# Patient Record
Sex: Male | Born: 1970 | Hispanic: Yes | Marital: Married | Smoking: Former smoker
Health system: Southern US, Community
[De-identification: ages and names within clinical notes are randomized; demographics above are authoritative.]

## PROBLEM LIST (undated history)

## (undated) DIAGNOSIS — K219 Gastro-esophageal reflux disease without esophagitis: Secondary | ICD-10-CM

## (undated) DIAGNOSIS — J45909 Unspecified asthma, uncomplicated: Secondary | ICD-10-CM

## (undated) DIAGNOSIS — N529 Male erectile dysfunction, unspecified: Secondary | ICD-10-CM

## (undated) DIAGNOSIS — I10 Essential (primary) hypertension: Secondary | ICD-10-CM

## (undated) DIAGNOSIS — N400 Enlarged prostate without lower urinary tract symptoms: Secondary | ICD-10-CM

## (undated) HISTORY — DX: Benign prostatic hyperplasia without lower urinary tract symptoms: N40.0

## (undated) HISTORY — PX: HERNIA REPAIR: SHX51

## (undated) HISTORY — DX: Male erectile dysfunction, unspecified: N52.9

## (undated) HISTORY — DX: Gastro-esophageal reflux disease without esophagitis: K21.9

## (undated) HISTORY — PX: PROSTATECTOMY: SHX69

## (undated) HISTORY — DX: Unspecified asthma, uncomplicated: J45.909

## (undated) HISTORY — DX: Essential (primary) hypertension: I10

## (undated) HISTORY — PX: OTHER SURGICAL HISTORY: SHX169

---

## 2013-12-02 DIAGNOSIS — J45909 Unspecified asthma, uncomplicated: Secondary | ICD-10-CM

## 2013-12-02 DIAGNOSIS — J309 Allergic rhinitis, unspecified: Secondary | ICD-10-CM | POA: Insufficient documentation

## 2013-12-02 HISTORY — DX: Unspecified asthma, uncomplicated: J45.909

## 2017-03-06 DIAGNOSIS — N529 Male erectile dysfunction, unspecified: Secondary | ICD-10-CM

## 2017-03-06 DIAGNOSIS — N400 Enlarged prostate without lower urinary tract symptoms: Secondary | ICD-10-CM

## 2017-03-06 HISTORY — DX: Male erectile dysfunction, unspecified: N52.9

## 2017-03-06 HISTORY — DX: Benign prostatic hyperplasia without lower urinary tract symptoms: N40.0

## 2017-05-04 ENCOUNTER — Encounter: Payer: Self-pay | Admitting: Osteopathic Medicine

## 2017-05-04 ENCOUNTER — Ambulatory Visit (INDEPENDENT_AMBULATORY_CARE_PROVIDER_SITE_OTHER): Payer: Managed Care, Other (non HMO) | Admitting: Osteopathic Medicine

## 2017-05-04 VITALS — BP 133/76 | HR 58 | Wt 188.0 lb

## 2017-05-04 DIAGNOSIS — J309 Allergic rhinitis, unspecified: Secondary | ICD-10-CM

## 2017-05-04 DIAGNOSIS — F411 Generalized anxiety disorder: Secondary | ICD-10-CM | POA: Diagnosis not present

## 2017-05-04 DIAGNOSIS — N401 Enlarged prostate with lower urinary tract symptoms: Secondary | ICD-10-CM

## 2017-05-04 DIAGNOSIS — E876 Hypokalemia: Secondary | ICD-10-CM

## 2017-05-04 DIAGNOSIS — I10 Essential (primary) hypertension: Secondary | ICD-10-CM

## 2017-05-04 DIAGNOSIS — R490 Dysphonia: Secondary | ICD-10-CM | POA: Diagnosis not present

## 2017-05-04 DIAGNOSIS — J4540 Moderate persistent asthma, uncomplicated: Secondary | ICD-10-CM

## 2017-05-04 DIAGNOSIS — R3912 Poor urinary stream: Secondary | ICD-10-CM

## 2017-05-04 MED ORDER — BENAZEPRIL HCL 20 MG PO TABS
20.0000 mg | ORAL_TABLET | Freq: Every day | ORAL | 1 refills | Status: DC
Start: 2017-05-04 — End: 2017-06-01

## 2017-05-04 MED ORDER — BECLOMETHASONE DIPROPIONATE 80 MCG/ACT IN AERS: 2.0000 | INHALATION_SPRAY | Freq: Two times a day (BID) | RESPIRATORY_TRACT | 12 refills | Status: DC

## 2017-05-04 MED ORDER — METHYLPREDNISOLONE 4 MG PO TBPK: ORAL_TABLET | ORAL | 0 refills | Status: DC

## 2017-05-04 MED ORDER — ALBUTEROL SULFATE HFA 108 (90 BASE) MCG/ACT IN AERS: 1.0000 | INHALATION_SPRAY | RESPIRATORY_TRACT | 11 refills | Status: DC | PRN

## 2017-05-04 MED ORDER — ESCITALOPRAM OXALATE 5 MG PO TABS: 5.0000 mg | ORAL_TABLET | Freq: Every day | ORAL | 1 refills | Status: DC

## 2017-05-04 MED ORDER — ALPRAZOLAM 0.25 MG PO TABS: 0.2500 mg | ORAL_TABLET | Freq: Two times a day (BID) | ORAL | 1 refills | Status: DC | PRN

## 2017-05-04 NOTE — Patient Instructions (Addendum)
Plan  Blood pressure medicines:  STOP amlodipine START benazepril  Recheck blood pressure in 2 weeks with the nurse, bring your home blood pressure monitor to that visit  Anxiety: START escitalopram / Lexapro daily maintenance and prevention ONLY AS NEEDED alprazolam / Xanax for severe panic symptoms - no more than 2 or 3 times per week, and once the Lexapro gets in your system, hopefully less or not at all  Hoarsenss: START steroid treatment RESTART inhaler - ask your pharmacist if they can recommend a cheaper medicine Will send to specialist if not better   Asthma: RESTART Qvar inhaler daily for maintenance and prevention ONLY AS NEEDED albuterol for shortness of breath   Other: Labs ASAP

## 2017-05-04 NOTE — Progress Notes (Signed)
HPI: Nathan Lang is a 46 y.o. male  who presents to [facility] today, 05/04/17,  for chief complaint of:  Chief Complaint  Patient presents with  . Establish Care  . Cough    Cough and hoarseness - hard to talk, feels like he can't speak very loudly. Treated for pharyngitis/laryngitis in the ER.   Anxiety - Worse with stress, gets easily frustrated/irritable. Previously ER also wrote for alprazolam, looks like this is when he was there for chest pain rule out. Patient states that this was working quite well for him. He was not counseled on potential for dependence with this medicine and was using it pretty much every day until he ran out.  HTN - has been getting refills through the ER. Seeing cardiology previously and recent stress test showed no concerns per patient records are not available at this time, test performed 03/26/17 treadmill stress echo.  He is concerned about blood pressure going up on occasion despite amlodipine treatment. He is measuring blood pressure at home, unverified cuff. Try switching medications. He states he gets occasional headaches when blood pressure is high.   Urinary frequency/BPH: following with urology. Last visit 03/06/2017, records reviewed. Medical management with tamsulosin, oxybutynin. Also sildenafil was prescribed for ED.  Asthma: Has been off of inhalers for some time due to cost. No major shortness of breath but occasional cough.  Hypokalemia: Potassium was 2.8 when measured in ED 02/22/2017, not sure that this was ever addressed. We'll of course repeat today  Patient is accompanied by wife who assists with history-taking. Patient speaks very good [place] but occasionally struggles with certain words. She helps to translate when needed.  Past medical, surgical, social and family history reviewed: Patient Active Problem List   Diagnosis Date Noted  . GERD (gastroesophageal reflux disease) 05/05/2017  .  Hypertension 05/05/2017  . BPH (benign prostatic hyperplasia) 03/06/2017  . ED (erectile dysfunction) 03/06/2017  . Allergic rhinitis 12/02/2013  . Asthma 12/02/2013   Past Surgical History:  Procedure Laterality Date  . BREAST MASS EXCISION    . HERNIA REPAIR     Social History  Substance Use Topics  . Smoking status: Current Some Day Smoker    Types: Cigarettes  . Smokeless tobacco: Never Used  . Alcohol use Yes   Family History  Problem Relation Age of Onset  . Hypertension Mother   . Hypertension Brother      Current medication list and allergy/intolerance information reviewed:   Current Outpatient Prescriptions  Medication Sig Dispense Refill  . amLODipine (NORVASC) 5 MG tablet TAKE 1 TABLET BY MOUTH EVERY DAY    . beclomethasone (QVAR) 80 MCG/ACT inhaler Inhale into the lungs 2 (two) times daily.    Marland Kitchen oxybutynin (DITROPAN) 5 MG tablet Take by mouth.    . tamsulosin (FLOMAX) 0.4 MG CAPS capsule Take by mouth.     No current facility-administered medications for this visit.    No Known Allergies    Review of Systems:  Constitutional:  No  fever, no chills, No recent illness, No unintentional weight changes. No significant fatigue.   HEENT: No  headache, no vision change, no hearing change  Cardiac: No  chest pain, No  pressure, No palpitations, No  Orthopnea  Respiratory:  No  shortness of breath. No  Cough  Gastrointestinal: No  abdominal pain, No  nausea  Musculoskeletal: No new myalgia/arthralgia  Skin: No  Rash  Hem/Onc: No  easy bruising/bleeding,   Neurologic: No  weakness, No  dizziness  Psychiatric: No  concerns with depression, +concerns with anxiety, No sleep problems, No mood problems  Exam:  BP 133/76   Pulse (!) 58   Wt 188 lb (85.3 kg)   Constitutional: VS see above. General Appearance: alert, well-developed, well-nourished, NAD  Eyes: Normal lids and conjunctive, non-icteric sclera  Ears, Nose, Mouth, Throat: MMM, Normal external  inspection ears/nares/mouth/lips/gums.   Neck: No masses, trachea midline. No thyroid enlargement.   Respiratory: Normal respiratory effort. no wheeze, no rhonchi, no rales  Cardiovascular: S1/S2 normal, no murmur, no rub/gallop auscultated. RRR. No lower extremity edema.   Musculoskeletal: Gait normal.   Neurological: Normal balance/coordination. No tremor.   Skin: warm, dry, intact. No rash/ulcer.   Psychiatric: Normal judgment/insight. Normal mood and affect. Oriented x3.    ASSESSMENT/PLAN:   Essential hypertension - Plan: COMPLETE METABOLIC PANEL WITH GFR, CBC, Magnesium, Lipid panel  Anxiety - Plan: escitalopram (LEXAPRO) 5 MG tablet  Moderate persistent asthma without complication  Hoarseness of voice  Hypokalemia - Plan: COMPLETE METABOLIC PANEL WITH GFR  Allergic rhinitis, unspecified seasonality, unspecified trigger    Patient Instructions  Plan  Blood pressure medicines:  STOP amlodipine START benazepril  Recheck blood pressure in 2 weeks with the nurse, bring your home blood pressure monitor to that visit  Anxiety: START escitalopram / Lexapro daily maintenance and prevention ONLY AS NEEDED alprazolam / Xanax for severe panic symptoms - no more than 2 or 3 times per week, and once the Lexapro gets in your system, hopefully less or not at all  Hoarsenss: START steroid treatment RESTART inhaler - ask your pharmacist if they can recommend a cheaper medicine Will send to specialist if not better   Asthma: RESTART Qvar inhaler daily for maintenance and prevention ONLY AS NEEDED albuterol for shortness of breath   Other: Labs ASAP       Visit summary with medication list and pertinent instructions was printed for patient to review. All questions at time of visit were answered - patient instructed to contact office with any additional concerns. ER/RTC precautions were reviewed with the patient. Follow-up plan: Return in about 4 weeks (around  06/01/2017) for see how you're doing on new medicines . Encouraged to return in 2 weeks for nurse visit blood pressure check and to verify home monitor, four-week follow-up with Dr. Sheppard Coil for other chronic issues/medication changes  Note: Total time spent 45 minutes, greater than 50% of the visit was spent face-to-face counseling and coordinating care for the following: The primary encounter diagnosis was Essential hypertension. Diagnoses of Generalized anxiety disorder, Moderate persistent asthma without complication, Hoarseness of voice, Hypokalemia, Allergic rhinitis, unspecified seasonality, unspecified trigger, and Benign prostatic hyperplasia with weak urinary stream were also pertinent to this visit.Marland Kitchen

## 2017-05-05 ENCOUNTER — Ambulatory Visit: Payer: Managed Care, Other (non HMO) | Admitting: Osteopathic Medicine

## 2017-05-05 ENCOUNTER — Encounter: Payer: Self-pay | Admitting: Osteopathic Medicine

## 2017-05-05 DIAGNOSIS — I10 Essential (primary) hypertension: Secondary | ICD-10-CM

## 2017-05-05 DIAGNOSIS — K219 Gastro-esophageal reflux disease without esophagitis: Secondary | ICD-10-CM

## 2017-05-05 DIAGNOSIS — R490 Dysphonia: Secondary | ICD-10-CM | POA: Insufficient documentation

## 2017-05-05 DIAGNOSIS — E876 Hypokalemia: Secondary | ICD-10-CM | POA: Insufficient documentation

## 2017-05-05 DIAGNOSIS — F411 Generalized anxiety disorder: Secondary | ICD-10-CM | POA: Insufficient documentation

## 2017-05-05 HISTORY — DX: Gastro-esophageal reflux disease without esophagitis: K21.9

## 2017-05-05 HISTORY — DX: Essential (primary) hypertension: I10

## 2017-05-06 LAB — COMPLETE METABOLIC PANEL WITH GFR
AG RATIO: 2 (calc) (ref 1.0–2.5)
ALKALINE PHOSPHATASE (APISO): 72 U/L (ref 40–115)
ALT: 16 U/L (ref 9–46)
AST: 14 U/L (ref 10–40)
Albumin: 4.6 g/dL (ref 3.6–5.1)
BILIRUBIN TOTAL: 0.5 mg/dL (ref 0.2–1.2)
BUN: 17 mg/dL (ref 7–25)
CHLORIDE: 105 mmol/L (ref 98–110)
CO2: 24 mmol/L (ref 20–32)
Calcium: 9.3 mg/dL (ref 8.6–10.3)
Creat: 0.98 mg/dL (ref 0.60–1.35)
GFR, Est African American: 106 mL/min/{1.73_m2} (ref >=60)
GFR, Est Non African American: 91 mL/min/{1.73_m2} (ref >=60)
GLUCOSE: 105 mg/dL (ref 65–139)
Globulin: 2.3 g/dL (calc) (ref 1.9–3.7)
POTASSIUM: 3.5 mmol/L (ref 3.5–5.3)
Sodium: 138 mmol/L (ref 135–146)
Total Protein: 6.9 g/dL (ref 6.1–8.1)

## 2017-05-06 LAB — CBC
HCT: 42.9 % (ref 38.5–50.0)
Hemoglobin: 14.8 g/dL (ref 13.2–17.1)
MCH: 29.5 pg (ref 27.0–33.0)
MCHC: 34.5 g/dL (ref 32.0–36.0)
MCV: 85.5 fL (ref 80.0–100.0)
MPV: 10.8 fL (ref 7.5–12.5)
Platelets: 209 Thousand/uL (ref 140–400)
RBC: 5.02 Million/uL (ref 4.20–5.80)
RDW: 12.9 % (ref 11.0–15.0)
WBC: 4.7 Thousand/uL (ref 3.8–10.8)

## 2017-05-06 LAB — LIPID PANEL
CHOLESTEROL: 175 mg/dL (ref <200)
HDL: 41 mg/dL (ref >40)
LDL CHOLESTEROL (CALC): 108 mg/dL — AB
Non-HDL Cholesterol (Calc): 134 mg/dL (calc) — ABNORMAL HIGH (ref <130)
TRIGLYCERIDES: 150 mg/dL — AB (ref <150)
Total CHOL/HDL Ratio: 4.3 (calc) (ref <5.0)

## 2017-05-06 LAB — MAGNESIUM: MAGNESIUM: 2 mg/dL (ref 1.5–2.5)

## 2017-05-08 ENCOUNTER — Ambulatory Visit: Payer: Managed Care, Other (non HMO)

## 2017-05-22 ENCOUNTER — Ambulatory Visit: Payer: Managed Care, Other (non HMO)

## 2017-06-01 ENCOUNTER — Encounter: Payer: Self-pay | Admitting: Osteopathic Medicine

## 2017-06-01 ENCOUNTER — Ambulatory Visit (INDEPENDENT_AMBULATORY_CARE_PROVIDER_SITE_OTHER): Payer: Managed Care, Other (non HMO) | Admitting: Osteopathic Medicine

## 2017-06-01 VITALS — BP 154/87 | HR 63 | Temp 98.0°F | Resp 18 | Wt 188.4 lb

## 2017-06-01 DIAGNOSIS — E876 Hypokalemia: Secondary | ICD-10-CM

## 2017-06-01 DIAGNOSIS — Z23 Encounter for immunization: Secondary | ICD-10-CM | POA: Diagnosis not present

## 2017-06-01 DIAGNOSIS — I10 Essential (primary) hypertension: Secondary | ICD-10-CM | POA: Diagnosis not present

## 2017-06-01 DIAGNOSIS — F411 Generalized anxiety disorder: Secondary | ICD-10-CM | POA: Diagnosis not present

## 2017-06-01 LAB — BASIC METABOLIC PANEL WITH GFR
BUN/Creatinine Ratio: 15 (calc) (ref 6–22)
BUN: 21 mg/dL (ref 7–25)
CALCIUM: 9.2 mg/dL (ref 8.6–10.3)
CHLORIDE: 105 mmol/L (ref 98–110)
CO2: 25 mmol/L (ref 20–32)
Creat: 1.4 mg/dL — ABNORMAL HIGH (ref 0.60–1.35)
GFR, EST AFRICAN AMERICAN: 69 mL/min/{1.73_m2} (ref >=60)
GFR, EST NON AFRICAN AMERICAN: 59 mL/min/{1.73_m2} — AB (ref >=60)
Glucose, Bld: 96 mg/dL (ref 65–99)
Potassium: 3.8 mmol/L (ref 3.5–5.3)
Sodium: 139 mmol/L (ref 135–146)

## 2017-06-01 MED ORDER — BENAZEPRIL HCL 40 MG PO TABS: 40.0000 mg | ORAL_TABLET | Freq: Every day | ORAL | 1 refills | Status: DC

## 2017-06-01 NOTE — Progress Notes (Signed)
HPI: Nathan Lang is a 46 y.o. male who  has a past medical history of Asthma (12/02/2013), BPH (benign prostatic hyperplasia) (03/06/2017), ED (erectile dysfunction) (03/06/2017), GERD (gastroesophageal reflux disease) (05/05/2017), Hypertension, and Hypertension (05/05/2017).  he presents to [facility] today, 06/01/17,  for chief complaint of:  Chief Complaint  Patient presents with  . Follow-up    Hypertension  . Medication Management    Anxiety/Panic Attacks    HTN: Blood pressure still a bit on the high side today, he is worried about anxiety issues today. He has been measuring his blood pressure at home and a says it seems to have been a bit high, cannot really give me numbers. He does not have home blood pressure cuff with him today. Her, shortness of breath.  Anxiety: Last visit 05/04/17 "Worse with stress, gets easily frustrated/irritable. Previously ER also wrote for alprazolam, looks like this is when he was there for chest pain rule out. Patient states that this was working quite well for him. He was not counseled on potential for dependence with this medicine and was using it pretty much every day until he ran out." Plan at that point: "START escitalopram / Lexapro daily maintenance and prevention ONLY AS NEEDED alprazolam / Xanax for severe panic symptoms - no more than 2 or 3 times per week, and once the Lexapro gets in your system, hopefully less or not at all."  Today he reports anxiety is not much better. He went back to the ER 05/25/17 for chest pain - ACS ruled out. Recent stress test normal. D/C on Ativan 0.5 mg bid prn anxiety. He reports that he has also only been taking the Lexapro as needed and it has not been very helpful...    Past medical, surgical, social and family history reviewed:  Patient Active Problem List   Diagnosis Date Noted  . GERD (gastroesophageal reflux disease) 05/05/2017  . Hypertension 05/05/2017  . Hoarseness  of voice 05/05/2017  . Hypokalemia 05/05/2017  . Generalized anxiety disorder 05/05/2017  . BPH (benign prostatic hyperplasia) 03/06/2017  . ED (erectile dysfunction) 03/06/2017  . Allergic rhinitis 12/02/2013  . Asthma 12/02/2013    Past Surgical History:  Procedure Laterality Date  . BREAST MASS EXCISION    . HERNIA REPAIR      Social History   Tobacco Use  . Smoking status: Current Some Day Smoker    Types: Cigarettes  . Smokeless tobacco: Never Used  Substance Use Topics  . Alcohol use: Yes    Family History  Problem Relation Age of Onset  . Hypertension Mother   . Hypertension Brother      Current medication list and allergy/intolerance information reviewed:    Current Outpatient Medications  Medication Sig Dispense Refill  . albuterol (PROVENTIL HFA;VENTOLIN HFA) 108 (90 Base) MCG/ACT inhaler Inhale 1-2 puffs into the lungs every 4 (four) hours as needed for wheezing (FOR EMERGENCY USE). 2 Inhaler 11  . beclomethasone (QVAR) 80 MCG/ACT inhaler Inhale 2 puffs into the lungs 2 (two) times daily. EVERY DAY FOR MAINTENANCE 1 Inhaler 12  . benazepril (LOTENSIN) 20 MG tablet Take 1 tablet (20 mg total) by mouth daily. 30 tablet 1  . dexlansoprazole (DEXILANT) 60 MG capsule Take 60 mg daily by mouth.    . escitalopram (LEXAPRO) 5 MG tablet Take 1 tablet (5 mg total) by mouth daily. 90 tablet 1  . LORazepam (ATIVAN) 0.5 MG tablet Take 0.5 mg as directed by mouth.    Marland Kitchen  oxybutynin (DITROPAN) 5 MG tablet Take by mouth.    . tamsulosin (FLOMAX) 0.4 MG CAPS capsule Take 0.4 mg daily by mouth.     No current facility-administered medications for this visit.     No Known Allergies    Review of Systems:  Constitutional:  No  fever, no chills, No recent illness.   HEENT: No  headache, no vision change  Cardiac: No  chest pain, No  pressure, No palpitations, No  Orthopnea  Respiratory:  No  shortness of breath. No  Cough  Gastrointestinal: No  abdominal pain, No   nausea,  Musculoskeletal: No new myalgia/arthralgia  Skin: No  Rash,  Psychiatric: No  concerns with depression, +concerns with anxiety, No sleep problems, No mood problems  Exam:  BP (!) 154/87 (BP Location: Right Arm, Patient Position: Sitting, Cuff Size: Large)   Pulse 63   Temp 98 F (36.7 C) (Oral)   Resp 18   Wt 188 lb 6.4 oz (85.5 kg)   Constitutional: VS see above. General Appearance: alert, well-developed, well-nourished, NAD  Eyes: Normal lids and conjunctive, non-icteric sclera  Ears, Nose, Mouth, Throat: MMM, Normal external inspection ears/nares/mouth/lips/gums.  Neck: No masses, trachea midline. No thyroid enlargement. No tenderness/mass appreciated. No lymphadenopathy  Respiratory: Normal respiratory effort. no wheeze, no rhonchi, no rales  Cardiovascular: S1/S2 normal, no murmur, no rub/gallop auscultated. RRR. No lower extremity edema.    Musculoskeletal: Gait normal.   Neurological: Normal balance/coordination. No tremor.   Psychiatric: Normal judgment/insight. Normal mood and affect. Oriented x3.    ER records reviewed, no major concerns, CMP demonstrated hypokalemia, chest x-ray and EKG not concerning    ASSESSMENT/PLAN:   Generalized anxiety disorder - Advised on daily/maintenance of Lexapro, when necessary only use of benzodiazepines and advised that these are habit forming medications  Hypokalemia - Plan: BASIC METABOLIC PANEL WITH GFR  Need for immunization against influenza - Plan: Flu Vaccine QUAD 36+ mos IM  Essential hypertension - Advised to bring home blood pressure cuff to next visit    Patient Instructions  Plan:  CONTINUE Escitalopram (Lexapro) daily, every day EMERGENCY USE ONLY Lorazepam (Ativan) for severe anxiety  INCREASE Benazepril from 20 mg to 40 mg for blood pressure   Bring your home blood pressure cuff to the office at your next visit so we can make sure it is accurate     Visit summary with medication list and  pertinent instructions was printed for patient to review. All questions at time of visit were answered - patient instructed to contact office with any additional concerns. ER/RTC precautions were reviewed with the patient. Follow-up plan: No Follow-up on file.  Note: Total time spent 25 minutes, greater than 50% of the visit was spent face-to-face counseling and coordinating care for the following: The primary encounter diagnosis was Generalized anxiety disorder. Diagnoses of Hypokalemia, Need for immunization against influenza, and Essential hypertension were also pertinent to this visit.Marland Kitchen  Please note: voice recognition software was used to produce this document, and typos may escape review. Please contact Dr. Sheppard Coil for any needed clarifications.

## 2017-06-01 NOTE — Patient Instructions (Signed)
Plan:  CONTINUE Escitalopram (Lexapro) daily, every day EMERGENCY USE ONLY Lorazepam (Ativan) for severe anxiety  INCREASE Benazepril from 20 mg to 40 mg for blood pressure   Bring your home blood pressure cuff to the office at your next visit so we can make sure it is accurate

## 2017-06-17 ENCOUNTER — Encounter: Payer: Self-pay | Admitting: Osteopathic Medicine

## 2017-06-17 ENCOUNTER — Ambulatory Visit: Payer: Managed Care, Other (non HMO) | Admitting: Osteopathic Medicine

## 2017-06-17 VITALS — BP 147/88 | HR 72 | Temp 97.6°F | Resp 16 | Ht 67.0 in | Wt 184.0 lb

## 2017-06-17 DIAGNOSIS — I10 Essential (primary) hypertension: Secondary | ICD-10-CM | POA: Diagnosis not present

## 2017-06-17 DIAGNOSIS — F411 Generalized anxiety disorder: Secondary | ICD-10-CM

## 2017-06-17 MED ORDER — BENAZEPRIL HCL 40 MG PO TABS: 40.0000 mg | ORAL_TABLET | Freq: Every day | ORAL | 1 refills | Status: DC

## 2017-06-17 MED ORDER — ESCITALOPRAM OXALATE 10 MG PO TABS: 10.0000 mg | ORAL_TABLET | Freq: Every day | ORAL | 1 refills | Status: DC

## 2017-06-17 NOTE — Progress Notes (Signed)
HPI: Nathan Lang is a 46 y.o. male who  has a past medical history of Asthma (12/02/2013), BPH (benign prostatic hyperplasia) (03/06/2017), ED (erectile dysfunction) (03/06/2017), GERD (gastroesophageal reflux disease) (05/05/2017), Hypertension, and Hypertension (05/05/2017).  he presents to [facility] today, 06/17/17,  for chief complaint of:  Chief Complaint  Patient presents with  . Follow-up    anxiety    Anxiety - Worse with stress, gets easily frustrated/irritable. Previously ER also wrote for alprazolam. He was not counseled on potential for dependence with this medicine and was using it pretty much every day until he ran out. Last visit a little over a month ago 05/04/2017, we initiated low dose Lexapro for maintenance and as needed alprazolam for panic symptoms, instructed use only 2-3 times per week. He reports some improvement today on the citalopram and he has the benzodiazepine leftover  HTN - previously felt like blood pressure was fluctuating on amlodipine alone. We tried benazepril at previous visit. He states he gets occasional headaches when blood pressure is high.  Recent evaluation/treatment in ER for chest pain, 05/24/2017, blood pressure at that time was 180s /110s. THen cam down to 158/83. Has not been taking the benazepril at full dose, has been taking 20 mg.  No chest pain, pressure, shortness of breath or headache today. 162/101 on home cuff 147/88 immediately after on automatic cuff.    Past medical, surgical, social and family history reviewed:  Patient Active Problem List   Diagnosis Date Noted  . GERD (gastroesophageal reflux disease) 05/05/2017  . Hypertension 05/05/2017  . Hoarseness of voice 05/05/2017  . Hypokalemia 05/05/2017  . Generalized anxiety disorder 05/05/2017  . BPH (benign prostatic hyperplasia) 03/06/2017  . ED (erectile dysfunction) 03/06/2017  . Allergic rhinitis 12/02/2013  . Asthma 12/02/2013     Past Surgical History:  Procedure Laterality Date  . BREAST MASS EXCISION    . HERNIA REPAIR      Social History   Tobacco Use  . Smoking status: Current Some Day Smoker    Types: Cigarettes  . Smokeless tobacco: Never Used  Substance Use Topics  . Alcohol use: Yes    Family History  Problem Relation Age of Onset  . Hypertension Mother   . Hypertension Brother      Current medication list and allergy/intolerance information reviewed:    Current Outpatient Medications  Medication Sig Dispense Refill  . albuterol (PROVENTIL HFA;VENTOLIN HFA) 108 (90 Base) MCG/ACT inhaler Inhale 1-2 puffs into the lungs every 4 (four) hours as needed for wheezing (FOR EMERGENCY USE). 2 Inhaler 11  . beclomethasone (QVAR) 80 MCG/ACT inhaler Inhale 2 puffs into the lungs 2 (two) times daily. EVERY DAY FOR MAINTENANCE 1 Inhaler 12  . benazepril (LOTENSIN) 40 MG tablet Take 1 tablet (40 mg total) daily by mouth. (Patient taking differently: Take 40 mg by mouth daily. Patient takes 20 mg) 30 tablet 1  . dexlansoprazole (DEXILANT) 60 MG capsule Take 60 mg daily by mouth.    . escitalopram (LEXAPRO) 5 MG tablet Take 1 tablet (5 mg total) by mouth daily. 90 tablet 1  . LORazepam (ATIVAN) 0.5 MG tablet Take 0.5 mg as directed by mouth.    . oxybutynin (DITROPAN) 5 MG tablet Take by mouth.    . tamsulosin (FLOMAX) 0.4 MG CAPS capsule Take 0.4 mg daily by mouth.     No current facility-administered medications for this visit.     No Known Allergies    Review of Systems:  Constitutional:  No  fever, no chills,  HEENT: No  headache,  Cardiac: No  chest pain, No  pressure, No palpitations, No  Orthopnea  Respiratory:  No  shortness of breath. No  Cough  Neurologic: No  weakness, No  dizziness  Psychiatric: No  concerns with depression, No  concerns with anxiety, No sleep problems, No mood problems  Exam:  BP (!) 147/88   Pulse 72   Temp 97.6 F (36.4 C) (Oral)   Resp 16   Ht 5\' 7"   (1.702 m)   Wt 184 lb (83.5 kg)   SpO2 98%   BMI 28.82 kg/m   Constitutional: VS see above. General Appearance: alert, well-developed, well-nourished, NAD  Eyes: Normal lids and conjunctive, non-icteric sclera  Ears, Nose, Mouth, Throat: MMM, Normal external inspection ears/nares/mouth/lips/gums  Neck: No masses, trachea midline.   Respiratory: Normal respiratory effort. no wheeze, no rhonchi, no rales  Cardiovascular: S1/S2 normal, no murmur, no rub/gallop auscultated. RRR.  Gastrointestinal: Nontender, no masses.   Musculoskeletal: Gait normal.   Neurological: Normal balance/coordination. No tremor.   Skin: warm, dry, intact.   Psychiatric: Normal judgment/insight. Normal mood and affect. Oriented x3.    ASSESSMENT/PLAN:   Essential hypertension  Generalized anxiety disorder - Plan: escitalopram (LEXAPRO) 10 MG tablet    Patient Instructions  Plan:  Escitalopram (Lexapro) for anxiety, increasing to 10 mg every day  Benazepril for blood pressure, increase to 40 mg every day, if you get dizzy or feel weak, go back to 1/2 tablet      Visit summary with medication list and pertinent instructions was printed for patient to review. All questions at time of visit were answered - patient instructed to contact office with any additional concerns. ER/RTC precautions were reviewed with the patient. Follow-up plan: Return in about 2 weeks (around 07/01/2017) for recheck blood pressure, and higher dose anxiety medication - sooner if needed .  Note: Total time spent 25 minutes, greater than 50% of the visit was spent face-to-face counseling and coordinating care for the following: The primary encounter diagnosis was Essential hypertension. A diagnosis of Generalized anxiety disorder was also pertinent to this visit.Marland Kitchen  Please note: voice recognition software was used to produce this document, and typos may escape review. Please contact Dr. Sheppard Coil for any needed clarifications.

## 2017-06-17 NOTE — Patient Instructions (Addendum)
Plan:  Escitalopram (Lexapro) for anxiety, increasing to 10 mg every day  Benazepril for blood pressure, increase to 40 mg every day, if you get dizzy or feel weak, go back to 1/2 tablet

## 2017-07-22 ENCOUNTER — Encounter: Payer: Self-pay | Admitting: Osteopathic Medicine

## 2017-07-22 ENCOUNTER — Ambulatory Visit (INDEPENDENT_AMBULATORY_CARE_PROVIDER_SITE_OTHER): Payer: Managed Care, Other (non HMO) | Admitting: Osteopathic Medicine

## 2017-07-22 VITALS — BP 126/71 | HR 54 | Temp 97.6°F | Wt 183.2 lb

## 2017-07-22 DIAGNOSIS — I10 Essential (primary) hypertension: Secondary | ICD-10-CM

## 2017-07-22 DIAGNOSIS — R6881 Early satiety: Secondary | ICD-10-CM

## 2017-07-22 MED ORDER — BENAZEPRIL HCL 40 MG PO TABS: 40.0000 mg | ORAL_TABLET | Freq: Every day | ORAL | 1 refills | Status: DC

## 2017-07-22 MED ORDER — HYDROCHLOROTHIAZIDE 12.5 MG PO TABS: 12.5000 mg | ORAL_TABLET | Freq: Every day | ORAL | 1 refills | Status: DC

## 2017-07-22 NOTE — Progress Notes (Signed)
HPI: Nathan Lang is a 47 y.o. male who  has a past medical history of Asthma (12/02/2013), BPH (benign prostatic hyperplasia) (03/06/2017), ED (erectile dysfunction) (03/06/2017), GERD (gastroesophageal reflux disease) (05/05/2017), Hypertension, and Hypertension (05/05/2017).  he presents to [facility] today, 07/22/17,  for chief complaint of:  Chief Complaint  Patient presents with  . GI Problem    Abdominal issue x2 weeks, loss of appetite, when he eats even just a little bit, he feels full. If he tries to force what he regularly eats, he feels nauseous. Very small portions. Low energy. Wt as low as 179 lb at home. Weight has increased a little bit since eating smaller more frequent portions. No particular issue with abdominal pain. No diarrhea/constipation, no blood in stool. Some nausea but no vomiting    Past medical, surgical, social and family history reviewed:  Patient Active Problem List   Diagnosis Date Noted  . GERD (gastroesophageal reflux disease) 05/05/2017  . Hypertension 05/05/2017  . Hoarseness of voice 05/05/2017  . Hypokalemia 05/05/2017  . Generalized anxiety disorder 05/05/2017  . BPH (benign prostatic hyperplasia) 03/06/2017  . ED (erectile dysfunction) 03/06/2017  . Allergic rhinitis 12/02/2013  . Asthma 12/02/2013    Past Surgical History:  Procedure Laterality Date  . BREAST MASS EXCISION    . HERNIA REPAIR      Social History   Tobacco Use  . Smoking status: Current Some Day Smoker    Types: Cigarettes  . Smokeless tobacco: Never Used  Substance Use Topics  . Alcohol use: Yes    Family History  Problem Relation Age of Onset  . Hypertension Mother   . Hypertension Brother      Current medication list and allergy/intolerance information reviewed:    Current Outpatient Medications  Medication Sig Dispense Refill  . albuterol (PROVENTIL HFA;VENTOLIN HFA) 108 (90 Base) MCG/ACT inhaler Inhale 1-2 puffs into  the lungs every 4 (four) hours as needed for wheezing (FOR EMERGENCY USE). 2 Inhaler 11  . beclomethasone (QVAR) 80 MCG/ACT inhaler Inhale 2 puffs into the lungs 2 (two) times daily. EVERY DAY FOR MAINTENANCE 1 Inhaler 12  . benazepril (LOTENSIN) 40 MG tablet Take 1 tablet (40 mg total) by mouth daily. 30 tablet 1  . dexlansoprazole (DEXILANT) 60 MG capsule Take 60 mg daily by mouth.    . escitalopram (LEXAPRO) 10 MG tablet Take 1 tablet (10 mg total) by mouth daily. 90 tablet 1  . LORazepam (ATIVAN) 0.5 MG tablet Take 0.5 mg as directed by mouth.    . oxybutynin (DITROPAN) 5 MG tablet Take by mouth.    . tamsulosin (FLOMAX) 0.4 MG CAPS capsule Take 0.4 mg daily by mouth.    . escitalopram (LEXAPRO) 5 MG tablet   1   No current facility-administered medications for this visit.     No Known Allergies    Review of Systems:  Constitutional:  No  fever, no chills, No recent illness, +unintentional weight changes. +significant fatigue.   HEENT: No  headache, no vision change  Cardiac: No  chest pain, No  pressure, No palpitations  Respiratory:  No  shortness of breath. No  Cough  Gastrointestinal: No  abdominal pain, No  nausea, No  vomiting,  No  blood in stool, No  diarrhea, No  constipation   Musculoskeletal: No new myalgia/arthralgia  Skin: No  Rash  Psychiatric: No  concerns with depression, No  concerns with anxiety  Exam:  BP 126/71   Pulse Marland Kitchen)  54   Temp 97.6 F (36.4 C) (Oral)   Wt 183 lb 3.2 oz (83.1 kg)   BMI 28.69 kg/m   Constitutional: VS see above. General Appearance: alert, well-developed, well-nourished, NAD  Eyes: Normal lids and conjunctive, non-icteric sclera  Ears, Nose, Mouth, Throat: MMM, Normal external inspection ears/nares/mouth/lips/gums.   Neck: No masses, trachea midline. No thyroid enlargement. No tenderness/mass appreciated. No lymphadenopathy  Respiratory: Normal respiratory effort. no wheeze, no rhonchi, no rales  Cardiovascular: S1/S2  normal, no murmur, no rub/gallop auscultated. RRR.   Gastrointestinal: Nontender, no masses. No hepatomegaly, no splenomegaly. No hernia appreciated. Bowel sounds normal. Rectal exam deferred.   Musculoskeletal: Gait normal. No clubbing/cyanosis of digits.   Neurological: Normal balance/coordination. No tremor.   Skin: warm, dry, intact. No rash/ulcer.     Psychiatric: Normal judgment/insight. Normal mood and affect. Oriented x3.     ASSESSMENT/PLAN:   Early satiety - Plan: Ambulatory referral to Gastroenterology - probably going to require EGD. No pain with swallowing. Advised if vomiting blood/coffee-ground, needs to go to hospital ASAP  Essential hypertension - Plan: benazepril (LOTENSIN) 40 MG tablet, hydrochlorothiazide (HYDRODIURIL) 12.5 MG tablet      Visit summary with medication list and pertinent instructions was printed for patient to review. All questions at time of visit were answered - patient instructed to contact office with any additional concerns. ER/RTC precautions were reviewed with the patient.   Follow-up plan: Return in about 2 weeks (around 08/05/2017) for recheck blood pressure and blood work on new medication .  Note: Total time spent 25 minutes, greater than 50% of the visit was spent face-to-face counseling and coordinating care for the following: The primary encounter diagnosis was Early satiety. A diagnosis of Essential hypertension was also pertinent to this visit.Marland Kitchen  Please note: voice recognition software was used to produce this document, and typos may escape review. Please contact Dr. Sheppard Coil for any needed clarifications.

## 2017-07-23 ENCOUNTER — Encounter: Payer: Self-pay | Admitting: Osteopathic Medicine

## 2017-07-23 DIAGNOSIS — R6881 Early satiety: Secondary | ICD-10-CM | POA: Insufficient documentation

## 2017-09-23 ENCOUNTER — Encounter: Payer: Self-pay | Admitting: Osteopathic Medicine

## 2017-11-09 ENCOUNTER — Other Ambulatory Visit: Payer: Self-pay | Admitting: Osteopathic Medicine

## 2017-11-09 DIAGNOSIS — I10 Essential (primary) hypertension: Secondary | ICD-10-CM

## 2017-12-09 ENCOUNTER — Other Ambulatory Visit: Payer: Self-pay | Admitting: Osteopathic Medicine

## 2017-12-09 DIAGNOSIS — I10 Essential (primary) hypertension: Secondary | ICD-10-CM

## 2017-12-17 ENCOUNTER — Encounter: Payer: Self-pay | Admitting: Osteopathic Medicine

## 2017-12-17 ENCOUNTER — Ambulatory Visit (INDEPENDENT_AMBULATORY_CARE_PROVIDER_SITE_OTHER): Payer: Managed Care, Other (non HMO) | Admitting: Osteopathic Medicine

## 2017-12-17 ENCOUNTER — Ambulatory Visit (INDEPENDENT_AMBULATORY_CARE_PROVIDER_SITE_OTHER): Payer: Managed Care, Other (non HMO)

## 2017-12-17 VITALS — BP 125/82 | HR 56 | Ht 67.01 in | Wt 172.0 lb

## 2017-12-17 DIAGNOSIS — R634 Abnormal weight loss: Secondary | ICD-10-CM | POA: Diagnosis not present

## 2017-12-17 DIAGNOSIS — I10 Essential (primary) hypertension: Secondary | ICD-10-CM

## 2017-12-17 DIAGNOSIS — Z9889 Other specified postprocedural states: Secondary | ICD-10-CM

## 2017-12-17 MED ORDER — BENAZEPRIL HCL 40 MG PO TABS: 40.0000 mg | ORAL_TABLET | Freq: Every day | ORAL | 1 refills | Status: DC

## 2017-12-17 NOTE — Progress Notes (Signed)
HPI: Nathan Lang is a 47 y.o. male who  has a past medical history of Asthma (12/02/2013), BPH (benign prostatic hyperplasia) (03/06/2017), ED (erectile dysfunction) (03/06/2017), GERD (gastroesophageal reflux disease) (05/05/2017), Hypertension, and Hypertension (05/05/2017).  he presents to [facility] today, 12/17/17,  for chief complaint of: Weight loss   188 lbs in 05/2017. 172 now = 16 lb weight loss x6 mos. I saw him in 07/2017 for early satiety issues, I referred him to GI but doesn't look like he has followed up on this.  He states that he does not exactly feel early satiety problems, his acid reflux issues seem to have gotten better.  He does not note any night sweats or fever, no significant change in energy levels, no rashes, nail changes, hair changes.  No unusual joint pain.  No concerns for depression though he notes occasional anxiety.  Requests annual routine labs     Past medical, surgical, social and family history reviewed:  Patient Active Problem List   Diagnosis Date Noted  . Early satiety 07/23/2017  . GERD (gastroesophageal reflux disease) 05/05/2017  . Hypertension 05/05/2017  . Hoarseness of voice 05/05/2017  . Hypokalemia 05/05/2017  . Generalized anxiety disorder 05/05/2017  . BPH (benign prostatic hyperplasia) 03/06/2017  . ED (erectile dysfunction) 03/06/2017  . Allergic rhinitis 12/02/2013  . Asthma 12/02/2013    Past Surgical History:  Procedure Laterality Date  . BREAST MASS EXCISION    . HERNIA REPAIR      Social History   Tobacco Use  . Smoking status: Current Some Day Smoker    Types: Cigarettes  . Smokeless tobacco: Never Used  Substance Use Topics  . Alcohol use: Yes    Family History  Problem Relation Age of Onset  . Hypertension Mother   . Hypertension Brother      Current medication list and allergy/intolerance information reviewed:    Current Outpatient Medications  Medication Sig  Dispense Refill  . albuterol (PROVENTIL HFA;VENTOLIN HFA) 108 (90 Base) MCG/ACT inhaler Inhale 1-2 puffs into the lungs every 4 (four) hours as needed for wheezing (FOR EMERGENCY USE). 2 Inhaler 11  . beclomethasone (QVAR) 80 MCG/ACT inhaler Inhale 2 puffs into the lungs 2 (two) times daily. EVERY DAY FOR MAINTENANCE 1 Inhaler 12  . benazepril (LOTENSIN) 40 MG tablet TAKE 1 TABLET(40 MG) BY MOUTH DAILY. NEED FOLLOW UP APPOINTMENT 15 tablet 0  . dexlansoprazole (DEXILANT) 60 MG capsule Take 60 mg daily by mouth.    . hydrochlorothiazide (HYDRODIURIL) 12.5 MG tablet Take 1 tablet (12.5 mg total) by mouth daily. 30 tablet 1  . LORazepam (ATIVAN) 0.5 MG tablet Take 0.5 mg as directed by mouth.    . oxybutynin (DITROPAN) 5 MG tablet Take by mouth.    . tamsulosin (FLOMAX) 0.4 MG CAPS capsule Take 0.4 mg daily by mouth.     No current facility-administered medications for this visit.     No Known Allergies    Review of Systems:  Constitutional:  No  fever, no chills, No recent illness, +unintentional weight changes. No significant fatigue.   HEENT: No  headache, no vision change  Cardiac: No  chest pain, No  pressure, No palpitations  Respiratory:  No  shortness of breath. No  Cough  Gastrointestinal: No  abdominal pain, No  nausea, No  vomiting,  No  blood in stool, No  diarrhea, No  constipation   Musculoskeletal: No new myalgia/arthralgia  Skin: No  Rash  Hem/Onc: No  easy bruising/bleeding, No  abnormal lymph node  Neurologic: No  weakness, No  dizziness,  Psychiatric: No  concerns with depression, +concerns with anxiety, No sleep problems, No mood problems  Exam:  BP 125/82   Pulse (!) 56   Ht 5' 7.01" (1.702 m)   Wt 172 lb (78 kg)   SpO2 98%   BMI 26.93 kg/m   Constitutional: VS see above. General Appearance: alert, well-developed, well-nourished, NAD  Eyes: Normal lids and conjunctive, non-icteric sclera  Ears, Nose, Mouth, Throat: MMM, Normal external inspection  ears/nares/mouth/lips/gums. TM normal bilaterally. Pharynx/tonsils no erythema, no exudate. Nasal mucosa normal.   Neck: No masses, trachea midline. No thyroid enlargement. No tenderness/mass appreciated. No lymphadenopathy  Respiratory: Normal respiratory effort. no wheeze, no rhonchi, no rales  Cardiovascular: S1/S2 normal, no murmur, no rub/gallop auscultated. RRR. No lower extremity edema.  Gastrointestinal: Nontender, no masses. No hepatomegaly, no splenomegaly. No hernia appreciated. Bowel sounds normal. Rectal exam deferred.   Musculoskeletal: Gait normal. No clubbing/cyanosis of digits.   Neurological: Normal balance/coordination. No tremor. No cranial nerve deficit on limited exam. Motor and sensation intact and symmetric. Cerebellar reflexes intact. EOMI, PERRL   Skin: warm, dry, intact. No rash/ulcer.     Psychiatric: Normal judgment/insight. Normal mood and affect. Oriented x3.    No results found for this or any previous visit (from the past 72 hour(s)).  Dg Chest 2 View  Result Date: 12/18/2017 CLINICAL DATA:  47 year old male with unintentional weight loss. EXAM: CHEST - 2 VIEW COMPARISON:  None. FINDINGS: Lung volumes are within normal limits. Mediastinal contours are within normal limits. There are small surgical clips projecting in the anterior left mid lung. The lungs otherwise appear clear. No pneumothorax or pleural effusion. Visualized tracheal air column is within normal limits. No acute osseous abnormality identified. Negative visible bowel gas pattern. IMPRESSION: Negative radiographic appearance of the chest chest aside from clustered surgical clips in the anterior left mid lung. No acute cardiopulmonary abnormality. Electronically Signed   By: Genevie Ann M.D.   On: 12/18/2017 08:43     ASSESSMENT/PLAN:   Weight loss - 16 pounds over the course of 6 months, not particularly dramatic but patient is quite worried about this.  We will of course monitor weights.  Lab  Labs as below - Plan: benazepril (LOTENSIN) 40 MG tablet, CBC, COMPLETE METABOLIC PANEL WITH GFR, Lipid panel, TSH, Hemoglobin A1c, HIV antibody, Hepatitis C antibody, VITAMIN D 25 Hydroxy (Vit-D Deficiency, Fractures), Sedimentation rate, High sensitivity CRP, QuantiFERON-TB Gold Plus, DG Chest 2 View, PSA, Total with Reflex to PSA, Free  Essential hypertension - Plan: benazepril (LOTENSIN) 40 MG tablet, CBC, COMPLETE METABOLIC PANEL WITH GFR, Lipid panel, TSH, Hemoglobin A1c  History of lung surgery - Clips noted on chest x-ray, we confirmed he had some kind of mass removed which he reports benign.  May consider heme-onc follow-up?  PET scan?      Visit summary with medication list and pertinent instructions was printed for patient to review. All questions at time of visit were answered - patient instructed to contact office with any additional concerns. ER/RTC precautions were reviewed with the patient.   Follow-up plan: Return in about 6 weeks (around 01/28/2018) for recheck weight if blood work is normal .  Note: Total time spent 25 minutes, greater than 50% of the visit was spent face-to-face counseling and coordinating care for the following: The primary encounter diagnosis was Weight loss. Diagnoses of Essential hypertension and History of lung surgery were also  pertinent to this visit.Marland Kitchen  Please note: voice recognition software was used to produce this document, and typos may escape review. Please contact Dr. Sheppard Coil for any needed clarifications.

## 2017-12-18 ENCOUNTER — Encounter: Payer: Self-pay | Admitting: Osteopathic Medicine

## 2017-12-18 DIAGNOSIS — R634 Abnormal weight loss: Secondary | ICD-10-CM | POA: Insufficient documentation

## 2017-12-18 DIAGNOSIS — Z9889 Other specified postprocedural states: Secondary | ICD-10-CM | POA: Insufficient documentation

## 2017-12-18 DIAGNOSIS — Z923 Personal history of irradiation: Secondary | ICD-10-CM | POA: Insufficient documentation

## 2018-01-02 LAB — COMPLETE METABOLIC PANEL WITH GFR
AG RATIO: 2.1 (calc) (ref 1.0–2.5)
ALT: 14 U/L (ref 9–46)
AST: 15 U/L (ref 10–40)
Albumin: 4.5 g/dL (ref 3.6–5.1)
Alkaline phosphatase (APISO): 63 U/L (ref 40–115)
BILIRUBIN TOTAL: 0.5 mg/dL (ref 0.2–1.2)
BUN: 24 mg/dL (ref 7–25)
CO2: 24 mmol/L (ref 20–32)
Calcium: 9.5 mg/dL (ref 8.6–10.3)
Chloride: 107 mmol/L (ref 98–110)
Creat: 1.24 mg/dL (ref 0.60–1.35)
GFR, EST AFRICAN AMERICAN: 80 mL/min/{1.73_m2} (ref >=60)
GFR, Est Non African American: 69 mL/min/{1.73_m2} (ref >=60)
GLUCOSE: 95 mg/dL (ref 65–99)
Globulin: 2.1 g/dL (calc) (ref 1.9–3.7)
POTASSIUM: 3.8 mmol/L (ref 3.5–5.3)
Sodium: 139 mmol/L (ref 135–146)
TOTAL PROTEIN: 6.6 g/dL (ref 6.1–8.1)

## 2018-01-02 LAB — PSA, TOTAL WITH REFLEX TO PSA, FREE: PSA, Total: 1.9 ng/mL (ref <=4.0)

## 2018-01-02 LAB — LIPID PANEL
CHOLESTEROL: 175 mg/dL (ref <200)
HDL: 44 mg/dL (ref >40)
LDL CHOLESTEROL (CALC): 109 mg/dL — AB
Non-HDL Cholesterol (Calc): 131 mg/dL (calc) — ABNORMAL HIGH (ref <130)
TRIGLYCERIDES: 114 mg/dL (ref <150)
Total CHOL/HDL Ratio: 4 (calc) (ref <5.0)

## 2018-01-02 LAB — CBC
HCT: 39.7 % (ref 38.5–50.0)
Hemoglobin: 13.9 g/dL (ref 13.2–17.1)
MCH: 29.9 pg (ref 27.0–33.0)
MCHC: 35 g/dL (ref 32.0–36.0)
MCV: 85.4 fL (ref 80.0–100.0)
MPV: 11.1 fL (ref 7.5–12.5)
PLATELETS: 214 10*3/uL (ref 140–400)
RBC: 4.65 10*6/uL (ref 4.20–5.80)
RDW: 13.3 % (ref 11.0–15.0)
WBC: 4.1 10*3/uL (ref 3.8–10.8)

## 2018-01-02 LAB — HEMOGLOBIN A1C
EAG (MMOL/L): 5.5 (calc)
HEMOGLOBIN A1C: 5.1 %{Hb} (ref <5.7)
MEAN PLASMA GLUCOSE: 100 (calc)

## 2018-01-02 LAB — HEPATITIS C ANTIBODY
HEP C AB: NONREACTIVE
SIGNAL TO CUT-OFF: 0.05 (ref <1.00)

## 2018-01-02 LAB — QUANTIFERON-TB GOLD PLUS
NIL: 0.03 [IU]/mL
QuantiFERON-TB Gold Plus: NEGATIVE
TB1-NIL: 0 IU/mL
TB2-NIL: 0.01 IU/mL

## 2018-01-02 LAB — SEDIMENTATION RATE: Sed Rate: 2 mm/h (ref 0–15)

## 2018-01-02 LAB — HIGH SENSITIVITY CRP: hs-CRP: 1.9 mg/L

## 2018-01-02 LAB — HIV ANTIBODY (ROUTINE TESTING W REFLEX): HIV: NONREACTIVE

## 2018-01-02 LAB — TSH: TSH: 0.99 mIU/L (ref 0.40–4.50)

## 2018-01-02 LAB — VITAMIN D 25 HYDROXY (VIT D DEFICIENCY, FRACTURES): Vit D, 25-Hydroxy: 28 ng/mL — ABNORMAL LOW (ref 30–100)

## 2018-08-28 ENCOUNTER — Other Ambulatory Visit: Payer: Self-pay | Admitting: Osteopathic Medicine

## 2018-08-28 DIAGNOSIS — R634 Abnormal weight loss: Secondary | ICD-10-CM

## 2018-08-28 DIAGNOSIS — I10 Essential (primary) hypertension: Secondary | ICD-10-CM

## 2018-08-29 DIAGNOSIS — R079 Chest pain, unspecified: Secondary | ICD-10-CM | POA: Insufficient documentation

## 2018-08-31 MED ORDER — HYDROCODONE-ACETAMINOPHEN 5-325 MG PO TABS
1.00 | ORAL_TABLET | ORAL | Status: DC
Start: ? — End: 2018-08-31

## 2018-08-31 MED ORDER — GENERIC EXTERNAL MEDICATION
4.00 | Status: DC
Start: ? — End: 2018-08-31

## 2018-08-31 MED ORDER — SODIUM CHLORIDE 0.9 % IV SOLN
10.00 | INTRAVENOUS | Status: DC
Start: ? — End: 2018-08-31

## 2018-08-31 MED ORDER — LISINOPRIL 20 MG PO TABS
20.00 | ORAL_TABLET | ORAL | Status: DC
Start: 2018-08-31 — End: 2018-08-31

## 2018-08-31 MED ORDER — ACETAMINOPHEN 325 MG PO TABS
650.00 | ORAL_TABLET | ORAL | Status: DC
Start: ? — End: 2018-08-31

## 2018-08-31 MED ORDER — GENERIC EXTERNAL MEDICATION
10.00 | Status: DC
Start: ? — End: 2018-08-31

## 2018-08-31 MED ORDER — ALUM & MAG HYDROXIDE-SIMETH 200-200-20 MG/5ML PO SUSP
30.00 | ORAL | Status: DC
Start: ? — End: 2018-08-31

## 2018-08-31 MED ORDER — PANTOPRAZOLE SODIUM 40 MG PO TBEC
40.00 | DELAYED_RELEASE_TABLET | ORAL | Status: DC
Start: 2018-08-30 — End: 2018-08-31

## 2018-08-31 MED ORDER — HYDRALAZINE HCL 20 MG/ML IJ SOLN
10.00 | INTRAMUSCULAR | Status: DC
Start: ? — End: 2018-08-31

## 2018-08-31 MED ORDER — ALBUTEROL SULFATE HFA 108 (90 BASE) MCG/ACT IN AERS
2.00 | INHALATION_SPRAY | RESPIRATORY_TRACT | Status: DC
Start: ? — End: 2018-08-31

## 2018-08-31 MED ORDER — GENERIC EXTERNAL MEDICATION
.50 | Status: DC
Start: ? — End: 2018-08-31

## 2018-08-31 MED ORDER — MIRABEGRON ER 50 MG PO TB24
50.00 | ORAL_TABLET | ORAL | Status: DC
Start: 2018-08-31 — End: 2018-08-31

## 2018-08-31 MED ORDER — POTASSIUM CHLORIDE CRYS ER 20 MEQ PO TBCR
20.00 | EXTENDED_RELEASE_TABLET | ORAL | Status: DC
Start: 2018-08-31 — End: 2018-08-31

## 2018-08-31 MED ORDER — ALBUTEROL SULFATE (2.5 MG/3ML) 0.083% IN NEBU
2.50 | INHALATION_SOLUTION | RESPIRATORY_TRACT | Status: DC
Start: ? — End: 2018-08-31

## 2018-08-31 MED ORDER — AMLODIPINE BESYLATE 5 MG PO TABS
5.00 | ORAL_TABLET | ORAL | Status: DC
Start: 2018-08-31 — End: 2018-08-31

## 2018-08-31 MED ORDER — ESCITALOPRAM OXALATE 10 MG PO TABS
5.00 | ORAL_TABLET | ORAL | Status: DC
Start: 2018-08-31 — End: 2018-08-31

## 2018-08-31 MED ORDER — NICOTINE 21 MG/24HR TD PT24
1.00 | MEDICATED_PATCH | TRANSDERMAL | Status: DC
Start: 2018-08-31 — End: 2018-08-31

## 2018-08-31 MED ORDER — ENOXAPARIN SODIUM 40 MG/0.4ML ~~LOC~~ SOLN
40.00 | SUBCUTANEOUS | Status: DC
Start: 2018-08-31 — End: 2018-08-31

## 2018-08-31 MED ORDER — NITROGLYCERIN 0.4 MG SL SUBL
0.40 | SUBLINGUAL_TABLET | SUBLINGUAL | Status: DC
Start: ? — End: 2018-08-31

## 2018-08-31 MED ORDER — TAMSULOSIN HCL 0.4 MG PO CAPS
0.40 | ORAL_CAPSULE | ORAL | Status: DC
Start: 2018-08-30 — End: 2018-08-31

## 2018-08-31 MED ORDER — FINASTERIDE 5 MG PO TABS
5.00 | ORAL_TABLET | ORAL | Status: DC
Start: 2018-08-31 — End: 2018-08-31

## 2018-08-31 MED ORDER — SODIUM CHLORIDE 0.9 % IV SOLN
50.00 | INTRAVENOUS | Status: DC
Start: ? — End: 2018-08-31

## 2018-09-14 DIAGNOSIS — C61 Malignant neoplasm of prostate: Secondary | ICD-10-CM

## 2018-09-14 HISTORY — DX: Malignant neoplasm of prostate: C61

## 2018-10-26 ENCOUNTER — Ambulatory Visit (INDEPENDENT_AMBULATORY_CARE_PROVIDER_SITE_OTHER): Payer: Managed Care, Other (non HMO) | Admitting: Osteopathic Medicine

## 2018-10-26 ENCOUNTER — Other Ambulatory Visit: Payer: Self-pay

## 2018-10-26 DIAGNOSIS — I10 Essential (primary) hypertension: Secondary | ICD-10-CM | POA: Diagnosis not present

## 2018-10-26 NOTE — Progress Notes (Signed)
Virtual Visit  via Video or Phone Note  I connected with      Nathan Lang on 10/26/18  by a telemedicine application and verified that I am speaking with the correct person using two identifiers.   I discussed the limitations of evaluation and management by telemedicine and the availability of in person appointments. The patient expressed understanding and agreed to proceed.  History of Present Illness: Nathan Lang is a 48 y.o. male who would like to discuss medication refills and hand problem  No concerns other than needs refills.  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    Observations/Objective: BP 140/87 (Patient Position: Sitting, Cuff Size: Normal)   Wt 180 lb (81.6 kg)   BMI 28.19 kg/m  BP Readings from Last 3 Encounters:  10/26/18 140/87  12/17/17 125/82  07/22/17 126/71   Exam: Normal Speech.    Lab and Radiology Results No results found for this or any previous visit (from the past 72 hour(s)). No results found.     Assessment and Plan: 48 y.o. male with The encounter diagnosis was Essential hypertension.    Meds ordered this encounter  Medications  . benazepril (LOTENSIN) 40 MG tablet    Sig: Take 1 tablet (40 mg total) by mouth daily.    Dispense:  90 tablet    Refill:  3  . oxybutynin (DITROPAN) 5 MG tablet    Sig: Take 1 tablet (5 mg total) by mouth 3 (three) times daily.    Dispense:  270 tablet    Refill:  3      Follow Up Instructions: Return in about 3 months (around 01/25/2019) for annual physical - sooner if needed .    I discussed the assessment and treatment plan with the patient. The patient was provided an opportunity to ask questions and all were answered. The patient agreed with the plan and  demonstrated an understanding of the instructions.   The patient was advised to call back or seek an in-person evaluation if the symptoms worsen or if the condition fails to improve as anticipated.  I provided 15 minutes of non-face-to-face time during this encounter.                      Historical information moved to improve visibility of documentation.  Past Medical History:  Diagnosis Date  . Asthma 12/02/2013  . BPH (benign prostatic hyperplasia) 03/06/2017  . ED (erectile dysfunction) 03/06/2017  . GERD (gastroesophageal reflux disease) 05/05/2017  . Hypertension   . Hypertension 05/05/2017   Past Surgical History:  Procedure Laterality Date  . HERNIA REPAIR    . lung mass exc Left    no records available at this time, clips noted on CXR, pt reports benign mass was removed   Social History   Tobacco Use  . Smoking status: Current Some Day Smoker    Types: Cigarettes  . Smokeless tobacco: Never Used  Substance Use Topics  . Alcohol use: Yes   family history includes Hypertension in his brother and mother.  Medications: Current Outpatient Medications  Medication Sig Dispense Refill  . albuterol (PROVENTIL HFA;VENTOLIN HFA) 108 (90 Base) MCG/ACT inhaler Inhale 1-2 puffs into the lungs every 4 (four) hours as needed  for wheezing (FOR EMERGENCY USE). 2 Inhaler 11  . beclomethasone (QVAR) 80 MCG/ACT inhaler Inhale 2 puffs into the lungs 2 (two) times daily. EVERY DAY FOR MAINTENANCE 1 Inhaler 12  . benazepril (LOTENSIN) 40 MG tablet Take 1 tablet (40 mg total) by mouth daily. 90 tablet 3  . dexlansoprazole (DEXILANT) 60 MG capsule Take 60 mg daily by mouth.    . escitalopram (LEXAPRO) 10 MG tablet Take 10 mg by mouth daily.    Marland Kitchen oxybutynin (DITROPAN) 5 MG tablet Take 1 tablet (5 mg total) by mouth 3 (three) times daily. 270 tablet 3  . tamsulosin (FLOMAX) 0.4 MG CAPS capsule Take 0.4 mg daily by mouth.     No current facility-administered medications for  this visit.    No Known Allergies  PDMP not reviewed this encounter. No orders of the defined types were placed in this encounter.  Meds ordered this encounter  Medications  . benazepril (LOTENSIN) 40 MG tablet    Sig: Take 1 tablet (40 mg total) by mouth daily.    Dispense:  90 tablet    Refill:  3  . oxybutynin (DITROPAN) 5 MG tablet    Sig: Take 1 tablet (5 mg total) by mouth 3 (three) times daily.    Dispense:  270 tablet    Refill:  3

## 2018-10-29 ENCOUNTER — Telehealth: Payer: Self-pay | Admitting: Osteopathic Medicine

## 2018-10-29 MED ORDER — OXYBUTYNIN CHLORIDE 5 MG PO TABS: 5.0000 mg | ORAL_TABLET | Freq: Three times a day (TID) | ORAL | 3 refills | Status: DC

## 2018-10-29 MED ORDER — BENAZEPRIL HCL 40 MG PO TABS: 40.0000 mg | ORAL_TABLET | Freq: Every day | ORAL | 3 refills | Status: DC

## 2018-10-29 NOTE — Telephone Encounter (Signed)
Pt walked-in and states he seen Dr.Alexander This week and He thinks she forgot to call in his Albuterol Inhaler and need this TODAY as soon as possible sent to [facility] in [place] on [address]. Please call him and let him know when this is completed

## 2018-11-01 MED ORDER — ALBUTEROL SULFATE HFA 108 (90 BASE) MCG/ACT IN AERS: 1.0000 | INHALATION_SPRAY | RESPIRATORY_TRACT | 11 refills | Status: DC | PRN

## 2018-11-01 NOTE — Telephone Encounter (Signed)
rx sent

## 2018-11-04 MED ORDER — OXYCODONE HCL 5 MG PO TABS
5.00 | ORAL_TABLET | ORAL | Status: DC
Start: ? — End: 2018-11-04

## 2018-11-04 MED ORDER — LACTATED RINGERS IV SOLN
150.00 | INTRAVENOUS | Status: DC
Start: ? — End: 2018-11-04

## 2018-11-04 MED ORDER — PANTOPRAZOLE SODIUM 40 MG PO TBEC
40.00 | DELAYED_RELEASE_TABLET | ORAL | Status: DC
Start: 2018-11-03 — End: 2018-11-04

## 2018-11-04 MED ORDER — OXYBUTYNIN CHLORIDE 5 MG PO TABS
5.00 | ORAL_TABLET | ORAL | Status: DC
Start: 2018-11-03 — End: 2018-11-04

## 2018-11-04 MED ORDER — PHENAZOPYRIDINE HCL 100 MG PO TABS
100.00 | ORAL_TABLET | ORAL | Status: DC
Start: ? — End: 2018-11-04

## 2018-11-04 MED ORDER — SODIUM CHLORIDE 0.9 % IV SOLN
10.00 | INTRAVENOUS | Status: DC
Start: ? — End: 2018-11-04

## 2018-11-04 MED ORDER — OXYBUTYNIN CHLORIDE 5 MG PO TABS
5.00 | ORAL_TABLET | ORAL | Status: DC
Start: ? — End: 2018-11-04

## 2018-11-04 MED ORDER — DOCUSATE SODIUM 100 MG PO CAPS
100.00 | ORAL_CAPSULE | ORAL | Status: DC
Start: 2018-11-03 — End: 2018-11-04

## 2018-11-04 MED ORDER — TAMSULOSIN HCL 0.4 MG PO CAPS
0.40 | ORAL_CAPSULE | ORAL | Status: DC
Start: 2018-11-03 — End: 2018-11-04

## 2018-11-04 MED ORDER — ONDANSETRON HCL 4 MG/2ML IJ SOLN
4.00 | INTRAMUSCULAR | Status: DC
Start: ? — End: 2018-11-04

## 2018-11-04 MED ORDER — LISINOPRIL 20 MG PO TABS
40.00 | ORAL_TABLET | ORAL | Status: DC
Start: 2018-11-04 — End: 2018-11-04

## 2018-11-04 MED ORDER — HYDROMORPHONE HCL 1 MG/ML IJ SOLN
.20 | INTRAMUSCULAR | Status: DC
Start: ? — End: 2018-11-04

## 2018-12-01 ENCOUNTER — Telehealth: Payer: Self-pay

## 2018-12-01 NOTE — Telephone Encounter (Signed)
Pt's wife left a vm msg stating that she has some concerns about elevated bp for pt. Requesting if provider can double dose on bp med. Tried contacting pt for add'l info, no answer. Left a vm msg to return call back to clinic. Pls advise, thanks.

## 2018-12-02 ENCOUNTER — Telehealth: Payer: Self-pay

## 2018-12-02 NOTE — Telephone Encounter (Signed)
Left message with information below and for patient to call us back to schedule a webex.

## 2018-12-02 NOTE — Telephone Encounter (Signed)
Can schedule virtual visit

## 2018-12-02 NOTE — Telephone Encounter (Addendum)
Pt's wife returned call back for scheduling. She said best number to contact her is [phone]. Thanks.

## 2018-12-08 ENCOUNTER — Ambulatory Visit: Payer: Managed Care, Other (non HMO) | Admitting: Osteopathic Medicine

## 2018-12-10 ENCOUNTER — Encounter: Payer: Self-pay | Admitting: Osteopathic Medicine

## 2018-12-10 ENCOUNTER — Ambulatory Visit (INDEPENDENT_AMBULATORY_CARE_PROVIDER_SITE_OTHER): Payer: Managed Care, Other (non HMO) | Admitting: Osteopathic Medicine

## 2018-12-10 VITALS — BP 155/93 | HR 66 | Temp 98.2°F | Wt 181.8 lb

## 2018-12-10 DIAGNOSIS — I10 Essential (primary) hypertension: Secondary | ICD-10-CM

## 2018-12-10 DIAGNOSIS — Z23 Encounter for immunization: Secondary | ICD-10-CM

## 2018-12-10 DIAGNOSIS — Z9889 Other specified postprocedural states: Secondary | ICD-10-CM | POA: Diagnosis not present

## 2018-12-10 MED ORDER — ALBUTEROL SULFATE HFA 108 (90 BASE) MCG/ACT IN AERS: 1.0000 | INHALATION_SPRAY | RESPIRATORY_TRACT | 11 refills | Status: DC | PRN

## 2018-12-10 MED ORDER — BUDESONIDE-FORMOTEROL FUMARATE 80-4.5 MCG/ACT IN AERO: 2.0000 | INHALATION_SPRAY | Freq: Two times a day (BID) | RESPIRATORY_TRACT | 0 refills | Status: DC

## 2018-12-10 MED ORDER — HYDROCHLOROTHIAZIDE 25 MG PO TABS: 12.5000 mg | ORAL_TABLET | Freq: Every day | ORAL | 0 refills | Status: DC

## 2018-12-10 NOTE — Patient Instructions (Signed)
Check your blood pressure at home once or twice per day for the next week.  Write down what numbers you are getting.  Our goal is 130 on the top number, 80 on the bottom number, or under this.  Start the medication at half a tablet daily, can take with your other medicines.  We will set up a time to call you next week to go over the numbers and decide if we need to change the medications.  Sample given for Symbicort inhaler, can try this once or twice per day for the next few weeks and see if this helps breathing.  I also sent a refill of the albuterol inhaler which you can use in addition to the Symbicort as needed for difficult breathing.

## 2018-12-10 NOTE — Progress Notes (Signed)
HPI: Nathan Lang is a 49 y.o. male who  has a past medical history of Asthma (12/02/2013), BPH (benign prostatic hyperplasia) (03/06/2017), ED (erectile dysfunction) (03/06/2017), GERD (gastroesophageal reflux disease) (05/05/2017), Hypertension, and Hypertension (05/05/2017).  he presents to [facility] today, 12/10/18,  for chief complaint of:  Concern for elevated blood pressure  . Context: Patient is currently undergoing treatment for prostate cancer, Lupron injections, which do carry a less than 2% risk of adverse event of hypertension.  . Modifying factors: Patient is currently taking benazepril 40 mg daily . Assoc signs/symptoms: No chest pain, pressure, shortness of breath. Occasional +headaches.   BP Readings from Last 3 Encounters:  12/10/18 (!) 155/93  10/26/18 140/87  12/17/17 125/82    Also requests refill on albuterol, he was prescribed this about a month ago, using it maybe 2 or 3 days a week in the mornings for chest tightness/wheezing.  He asks if there is anything cheaper, coronavirus pandemic has created some financial difficulties for him, in addition to cancer treatments.    At today's visit 12/10/18 ... PMH, PSH, FH reviewed and updated as needed.  Current medication list and allergy/intolerance hx reviewed and updated as needed. (See remainder of HPI, ROS, Phys Exam below)   No results found.  No results found for this or any previous visit (from the past 72 hour(s)).        ASSESSMENT/PLAN: The primary encounter diagnosis was Need for Tdap vaccination. A diagnosis of Essential hypertension was also pertinent to this visit.   Orders Placed This Encounter  Procedures  . Tdap vaccine greater than or equal to 7yo IM     Meds ordered this encounter  Medications  . hydrochlorothiazide (HYDRODIURIL) 25 MG tablet    Sig: Take 0.5 tablets (12.5 mg total) by mouth daily.    Dispense:  90 tablet    Refill:  0  .  budesonide-formoterol (SYMBICORT) 80-4.5 MCG/ACT inhaler    Sig: Inhale 2 puffs into the lungs 2 (two) times daily.    Dispense:  1 Inhaler    Refill:  0  . albuterol (VENTOLIN HFA) 108 (90 Base) MCG/ACT inhaler    Sig: Inhale 1-2 puffs into the lungs every 4 (four) hours as needed for wheezing (FOR EMERGENCY USE).    Dispense:  2 Inhaler    Refill:  11    PLEASE RUN W/ GOODRX COUPON, THANKS!    Patient Instructions  Check your blood pressure at home once or twice per day for the next week.  Write down what numbers you are getting.  Our goal is 130 on the top number, 80 on the bottom number, or under this.  Start the medication at half a tablet daily, can take with your other medicines.  We will set up a time to call you next week to go over the numbers and decide if we need to change the medications.  Sample given for Symbicort inhaler, can try this once or twice per day for the next few weeks and see if this helps breathing.  I also sent a refill of the albuterol inhaler which you can use in addition to the Symbicort as needed for difficult breathing.       Follow-up plan: Return for Virtual visit in 1 week to review blood pressure, recheck breathing.                                                 ################################################# ################################################# ################################################# #################################################  No outpatient medications have been marked as taking for the 12/10/18 encounter (Office Visit) with Emeterio Reeve, DO.    No Known Allergies     Review of Systems:  Constitutional: No fever/chills   HEENT: +headache, no vision change  Cardiac: No  chest pain, No  pressure, No palpitations  Respiratory:  No  shortness of breath. No  Cough  Gastrointestinal: No  abdominal pain, no change in bowel habits  Musculoskeletal: No  new myalgia/arthralgia  Neurologic: No  weakness, No  Dizziness   Exam:  BP (!) 155/93 (BP Location: Left Arm, Patient Position: Sitting, Cuff Size: Normal)   Pulse 66   Temp 98.2 F (36.8 C) (Oral)   Wt 181 lb 12.8 oz (82.5 kg)   BMI 28.47 kg/m   Constitutional: VS see above. General Appearance: alert, well-developed, well-nourished, NAD  Eyes: Normal lids and conjunctive, non-icteric sclera  Ears, Nose, Mouth, Throat: MMM, Normal external inspection ears/nares/mouth/lips/gums.  Neck: No masses, trachea midline.   Respiratory: Normal respiratory effort. no wheeze, no rhonchi, no rales  Cardiovascular: S1/S2 normal, no murmur, no rub/gallop auscultated. RRR.   Musculoskeletal: Gait normal. Symmetric and independent movement of all extremities  Neurological: Normal balance/coordination. No tremor.  Skin: warm, dry, intact.   Psychiatric: Normal judgment/insight. Normal mood and affect. Oriented x3.       Visit summary with medication list and pertinent instructions was printed for patient to review, patient was advised to alert [place] if any updates are needed. All questions at time of visit were answered - patient instructed to contact office with any additional concerns. ER/RTC precautions were reviewed with the patient and understanding verbalized.    Please note: voice recognition software was used to produce this document, and typos may escape review. Please contact Dr. Sheppard Coil for any needed clarifications.    Follow up plan: Return for Virtual visit in 1 week to review blood pressure, recheck breathing.

## 2018-12-17 ENCOUNTER — Encounter: Payer: Self-pay | Admitting: Osteopathic Medicine

## 2018-12-17 ENCOUNTER — Telehealth: Payer: Self-pay

## 2018-12-17 ENCOUNTER — Ambulatory Visit (INDEPENDENT_AMBULATORY_CARE_PROVIDER_SITE_OTHER): Payer: Managed Care, Other (non HMO) | Admitting: Osteopathic Medicine

## 2018-12-17 DIAGNOSIS — I10 Essential (primary) hypertension: Secondary | ICD-10-CM

## 2018-12-17 NOTE — Progress Notes (Signed)
BP Readings from Last 3 Encounters:  12/17/18 (!) 157/86  12/10/18 (!) 155/93  10/26/18 140/87

## 2018-12-17 NOTE — Telephone Encounter (Signed)
Nathan Lang's appointment has been rescheduled. He went to pick up the HCTZ last week from the pharmacy and they gave him his prescription for oxybutynin. I spoke with the pharmacy this morning and the problem was that he has 2 profiles in the system at the pharmacy. I asked them to go ahead and fill the prescription for the HCTZ. Nathan Lang advised to go to the pharmacy to pick up the prescription. Also advised to check blood pressures daily and record on a blood pressure log that I printed and gave him. Also advised to schedule a follow up in office with Dr Sheppard Coil and bring the blood pressure monitor to his appointment so it can be checked for accuracy.   BP  157/86 WT 189lbs O2  98% HR 73

## 2018-12-20 ENCOUNTER — Encounter: Payer: Self-pay | Admitting: Osteopathic Medicine

## 2018-12-28 ENCOUNTER — Encounter: Payer: Self-pay | Admitting: Osteopathic Medicine

## 2018-12-28 ENCOUNTER — Other Ambulatory Visit: Payer: Self-pay

## 2018-12-28 ENCOUNTER — Ambulatory Visit (INDEPENDENT_AMBULATORY_CARE_PROVIDER_SITE_OTHER): Payer: Managed Care, Other (non HMO) | Admitting: Osteopathic Medicine

## 2018-12-28 DIAGNOSIS — I10 Essential (primary) hypertension: Secondary | ICD-10-CM

## 2018-12-28 MED ORDER — DEXLANSOPRAZOLE 60 MG PO CPDR: 60.0000 mg | DELAYED_RELEASE_CAPSULE | Freq: Every day | ORAL | 3 refills | Status: DC

## 2018-12-28 MED ORDER — ESCITALOPRAM OXALATE 10 MG PO TABS: 10.0000 mg | ORAL_TABLET | Freq: Every day | ORAL | 3 refills | Status: DC

## 2018-12-28 MED ORDER — TAMSULOSIN HCL 0.4 MG PO CAPS: 0.4000 mg | ORAL_CAPSULE | Freq: Every day | ORAL | 3 refills | Status: DC

## 2018-12-28 MED ORDER — HYDROCHLOROTHIAZIDE 25 MG PO TABS: 25.0000 mg | ORAL_TABLET | Freq: Every day | ORAL | 3 refills | Status: DC

## 2018-12-28 NOTE — Patient Instructions (Signed)
Plan:  Continue to check your BP at home  Goal: 130/80 or less  If higher than this, please wait 5 minutes and recheck  Write down the numbers like you have been doing  Will increase the Hydrochlorothiazide (HCTZ) from 12.5 mg (half pill) to 25 mg (whole pill) once per day   Can tyr taking the BP medicines (HCTZ and Benazepril) in the evenings   Tylenol is ok to take, careful with Ibuprofen or Aleve

## 2018-12-28 NOTE — Progress Notes (Signed)
HPI: Nathan Lang is a 48 y.o. male who  has a past medical history of Asthma (12/02/2013), BPH (benign prostatic hyperplasia) (03/06/2017), ED (erectile dysfunction) (03/06/2017), GERD (gastroesophageal reflux disease) (05/05/2017), Hypertension, Hypertension (05/05/2017), and Prostate cancer ([place]) (09/14/2018).  he presents to [facility] today, 12/28/18,  for chief complaint of:  BP recheck   Patient presents today for recheck blood pressure.  We recently added hydrochlorothiazide, he is taking half dose of 25 mg tablet.  Patient reports tolerating medication well.   Patient brings home blood pressure into the office for verification, it is within 10 points of hours.  BP Readings from Last 3 Encounters:  12/28/18 (!) 146/88  12/10/18 (!) 155/93  10/26/18 140/87       At today's visit 12/28/18 ... PMH, PSH, FH reviewed and updated as needed.  Current medication list and allergy/intolerance hx reviewed and updated as needed. (See remainder of HPI, ROS, Phys Exam below)          ASSESSMENT/PLAN: The encounter diagnosis was Essential hypertension.     Meds ordered this encounter  Medications  . dexlansoprazole (DEXILANT) 60 MG capsule    Sig: Take 1 capsule (60 mg total) by mouth daily.    Dispense:  90 capsule    Refill:  3  . escitalopram (LEXAPRO) 10 MG tablet    Sig: Take 1 tablet (10 mg total) by mouth daily.    Dispense:  90 tablet    Refill:  3  . tamsulosin (FLOMAX) 0.4 MG CAPS capsule    Sig: Take 1 capsule (0.4 mg total) by mouth daily.    Dispense:  90 capsule    Refill:  3  . hydrochlorothiazide (HYDRODIURIL) 25 MG tablet    Sig: Take 1 tablet (25 mg total) by mouth daily.    Dispense:  90 tablet    Refill:  3    Patient Instructions  Plan:  Continue to check your BP at home  Goal: 130/80 or less  If higher than this, please wait 5 minutes and recheck  Write down the numbers like you have been doing  Will  increase the Hydrochlorothiazide (HCTZ) from 12.5 mg (half pill) to 25 mg (whole pill) once per day   Can tyr taking the BP medicines (HCTZ and Benazepril) in the evenings   Tylenol is ok to take, careful with Ibuprofen or Aleve       Follow-up plan: Return in about 4 weeks (around 01/25/2019) for virtual or office visit - follow-up blood pressure, see me sooner if needed! .                                                 ################################################# ################################################# ################################################# #################################################    Current Meds  Medication Sig  . albuterol (VENTOLIN HFA) 108 (90 Base) MCG/ACT inhaler Inhale 1-2 puffs into the lungs every 4 (four) hours as needed for wheezing (FOR EMERGENCY USE).  Marland Kitchen beclomethasone (QVAR) 80 MCG/ACT inhaler Inhale 2 puffs into the lungs 2 (two) times daily. EVERY DAY FOR MAINTENANCE  . benazepril (LOTENSIN) 40 MG tablet Take 1 tablet (40 mg total) by mouth daily.  . budesonide-formoterol (SYMBICORT) 80-4.5 MCG/ACT inhaler Inhale 2 puffs into the lungs 2 (two) times daily.  Marland Kitchen dexlansoprazole (DEXILANT) 60 MG capsule Take 1 capsule (60 mg total) by mouth daily.  Marland Kitchen  escitalopram (LEXAPRO) 10 MG tablet Take 1 tablet (10 mg total) by mouth daily.  . hydrochlorothiazide (HYDRODIURIL) 25 MG tablet Take 1 tablet (25 mg total) by mouth daily.  Marland Kitchen oxybutynin (DITROPAN) 5 MG tablet Take 1 tablet (5 mg total) by mouth 3 (three) times daily.  . tamsulosin (FLOMAX) 0.4 MG CAPS capsule Take 1 capsule (0.4 mg total) by mouth daily.  . [DISCONTINUED] dexlansoprazole (DEXILANT) 60 MG capsule Take 60 mg daily by mouth.  . [DISCONTINUED] escitalopram (LEXAPRO) 10 MG tablet Take 10 mg by mouth daily.  . [DISCONTINUED] hydrochlorothiazide (HYDRODIURIL) 25 MG tablet Take 0.5 tablets (12.5 mg total) by mouth daily.  .  [DISCONTINUED] tamsulosin (FLOMAX) 0.4 MG CAPS capsule Take 0.4 mg daily by mouth.    No Known Allergies     Review of Systems:  Constitutional: No recent illness  HEENT: No  headache, no vision change  Cardiac: No  chest pain, No  pressure, No palpitations  Respiratory:  No  shortness of breath. No  Cough  Gastrointestinal: No  abdominal pain, no change on bowel habits  Musculoskeletal: No new myalgia/arthralgia  Neurologic: No  weakness, No  Dizziness  Psychiatric: No  concerns with depression, No  concerns with anxiety  Exam:  BP (!) 146/88 (BP Location: Left Arm, Patient Position: Sitting, Cuff Size: Normal)   Pulse (!) 57   Temp 98 F (36.7 C) (Oral)   Wt 192 lb 8 oz (87.3 kg)   BMI 30.14 kg/m   Constitutional: VS see above. General Appearance: alert, well-developed, well-nourished, NAD  Eyes: Normal lids and conjunctive, non-icteric sclera  Ears, Nose, Mouth, Throat: MMM, Normal external inspection ears/nares/mouth/lips/gums.  Neck: No masses, trachea midline.   Respiratory: Normal respiratory effort. no wheeze, no rhonchi, no rales  Cardiovascular: S1/S2 normal, no murmur, no rub/gallop auscultated. RRR.   Musculoskeletal: Gait normal. Symmetric and independent movement of all extremities  Neurological: Normal balance/coordination. No tremor.  Skin: warm, dry, intact.   Psychiatric: Normal judgment/insight. Normal mood and affect. Oriented x3.       Visit summary with medication list and pertinent instructions was printed for patient to review, patient was advised to alert [place] if any updates are needed. All questions at time of visit were answered - patient instructed to contact office with any additional concerns. ER/RTC precautions were reviewed with the patient and understanding verbalized.    Please note: voice recognition software was used to produce this document, and typos may escape review. Please contact Dr. Sheppard Coil for any needed  clarifications.    Follow up plan: Return in about 4 weeks (around 01/25/2019) for virtual or office visit - follow-up blood pressure, see me sooner if needed! Marland Kitchen

## 2019-06-03 ENCOUNTER — Telehealth: Payer: Self-pay | Admitting: Osteopathic Medicine

## 2019-06-03 NOTE — Telephone Encounter (Signed)
Called patient and go his VM. Advised him that with given SX, he should be evaluated sooner than next week. I advised patient to go to a close Urgent Care or Emergency Room for evaluation of left leg pain and swelling.  Note to PCP

## 2019-06-03 NOTE — Telephone Encounter (Signed)
Patient called, stating that he was having numbness and swelling in his left leg, also painful.Marland Kitchen and he is on BP meds... currently has an appointment set up with PCP on 06/08/19.

## 2019-06-06 NOTE — Telephone Encounter (Signed)
Noted, agree needs evaluation

## 2019-06-08 ENCOUNTER — Ambulatory Visit: Payer: Managed Care, Other (non HMO) | Admitting: Osteopathic Medicine

## 2019-07-13 ENCOUNTER — Ambulatory Visit (INDEPENDENT_AMBULATORY_CARE_PROVIDER_SITE_OTHER): Payer: Managed Care, Other (non HMO) | Admitting: Osteopathic Medicine

## 2019-07-13 ENCOUNTER — Other Ambulatory Visit: Payer: Self-pay

## 2019-07-13 ENCOUNTER — Encounter: Payer: Self-pay | Admitting: Osteopathic Medicine

## 2019-07-13 VITALS — BP 169/106 | HR 60 | Temp 98.0°F | Wt 186.1 lb

## 2019-07-13 DIAGNOSIS — C61 Malignant neoplasm of prostate: Secondary | ICD-10-CM | POA: Diagnosis not present

## 2019-07-13 DIAGNOSIS — K219 Gastro-esophageal reflux disease without esophagitis: Secondary | ICD-10-CM

## 2019-07-13 DIAGNOSIS — I10 Essential (primary) hypertension: Secondary | ICD-10-CM

## 2019-07-13 DIAGNOSIS — F411 Generalized anxiety disorder: Secondary | ICD-10-CM | POA: Diagnosis not present

## 2019-07-13 DIAGNOSIS — R748 Abnormal levels of other serum enzymes: Secondary | ICD-10-CM

## 2019-07-13 MED ORDER — ALBUTEROL SULFATE HFA 108 (90 BASE) MCG/ACT IN AERS: 1.0000 | INHALATION_SPRAY | RESPIRATORY_TRACT | 1 refills | Status: DC | PRN

## 2019-07-13 MED ORDER — BECLOMETHASONE DIPROPIONATE 80 MCG/ACT IN AERS: 2.0000 | INHALATION_SPRAY | Freq: Two times a day (BID) | RESPIRATORY_TRACT | 12 refills | Status: DC

## 2019-07-13 MED ORDER — FAMOTIDINE 40 MG PO TABS: 40.0000 mg | ORAL_TABLET | Freq: Every day | ORAL | 1 refills | Status: DC

## 2019-07-13 MED ORDER — BUDESONIDE-FORMOTEROL FUMARATE 80-4.5 MCG/ACT IN AERO: 2.0000 | INHALATION_SPRAY | Freq: Two times a day (BID) | RESPIRATORY_TRACT | 0 refills | Status: DC

## 2019-07-13 MED ORDER — METOPROLOL SUCCINATE ER 25 MG PO TB24: 25.0000 mg | ORAL_TABLET | Freq: Every day | ORAL | 1 refills | Status: DC

## 2019-07-13 NOTE — Progress Notes (Signed)
HPI: Nathan Lang is a 48 y.o. male who  has a past medical history of Asthma (12/02/2013), BPH (benign prostatic hyperplasia) (03/06/2017), ED (erectile dysfunction) (03/06/2017), GERD (gastroesophageal reflux disease) (05/05/2017), Hypertension, Hypertension (05/05/2017), and Prostate cancer ([place]) (09/14/2018).  he presents to [facility] today, 07/13/19,  for chief complaint of:  Leg pain Blood pressure  Leg pain . Location: R and L hips, worse on R . Quality: tingling, like ants in the skin . Severity: gets better as he moves throughout the day. Worse when he initially made the appt, but better now.  . Duration:  . Timing/Modifying factors: worse after time sitting still  . Assoc signs/symptoms: stiffness  HTN:  Oncologist (he's following for prostate CA) recommended he come see me re: BP. Pt reports home bP readings have been going up, home BP machine hasn't been checked here in awhile.   Antihypertensive Rx - Benazepril 40 mg daily, HCTZ 25 mg.  BP Readings from Last Encounters:  07/13/19 163/103 on intake 169/106 on 5 min recheck   06/30/19 At oncologist's 144/92  12/28/18 (!) 146/88  12/10/18 (!) 155/93  10/26/18 140/87   Elevated liver enzymes: Monitored by [facility] 06/30/19: AST 48, ALT 122 trending up from 12/01/18, was 28 and 60 respectively,  But better than on 04/22/19, was 56 and 124 respectively  A1 antitrypsin and hepatitis were negative, iron ok but ferritin high at 715 07/06/19  GERD: Oncologist gave him pantoprazole for "stomach pain" he's already on omeprazole       At today's visit 07/13/19 ... PMH, PSH, FH reviewed and updated as needed.  Current medication list and allergy/intolerance hx reviewed and updated as needed. (See remainder of HPI, ROS, Phys Exam below)   No results found.  No results found for this or any previous visit (from the past 72 hour(s)).        ASSESSMENT/PLAN: The primary encounter  diagnosis was Essential hypertension. Diagnoses of Gastroesophageal reflux disease without esophagitis, Prostate cancer ([place]), Generalized anxiety disorder, and Elevated liver enzymes were also pertinent to this visit.   Monitor BP, BB Rx hopefully will help w/ anxiety as well, pt advised possible fatigue adverse effect when starting new Rx   Advised avoidance of hepatotoxic EtOH and Tylenol will closely monitor liver enzymes, since these are trending down ok to defer [place] for now but will recheck labs 2 weeks and consider US/GI referral   See pt instructions   Pt seeing chiropractor for hip/leg pain and this is helping, continue this, pt would liek to defer hip XR for now and follow w/ sports med if not better   Orders Placed This Encounter  Procedures  . COMPLETE METABOLIC PANEL WITH GFR  . CBC     Meds ordered this encounter  Medications  . albuterol (VENTOLIN HFA) 108 (90 Base) MCG/ACT inhaler    Sig: Inhale 1-2 puffs into the lungs every 4 (four) hours as needed for wheezing (FOR EMERGENCY USE).    Dispense:  18 g    Refill:  1    PLEASE RUN W/ [employer] COUPON, THANKS!  . beclomethasone (QVAR) 80 MCG/ACT inhaler    Sig: Inhale 2 puffs into the lungs 2 (two) times daily. EVERY DAY FOR MAINTENANCE    Dispense:  1 Inhaler    Refill:  12  . budesonide-formoterol (SYMBICORT) 80-4.5 MCG/ACT inhaler    Sig: Inhale 2 puffs into the lungs 2 (two) times daily.    Dispense:  1 Inhaler  Refill:  0  . famotidine (PEPCID) 40 MG tablet    Sig: Take 1 tablet (40 mg total) by mouth daily.    Dispense:  90 tablet    Refill:  1  . metoprolol succinate (TOPROL-XL) 25 MG 24 hr tablet    Sig: Take 1 tablet (25 mg total) by mouth daily.    Dispense:  90 tablet    Refill:  1    Patient Instructions  Ill recheck blood pressure w/ a nurse in 2 weeks Continue the Benazepril and the Hydrochlorothiazide I have added Metoprolol for blood pressure  Will recheck blood work to follow up on liver  tests  Instead of Pantoprazole, I've sent in Famotidine aka Pepcid Take this with the Omeprazole aka Dexilant         Follow-up plan: Return in about 2 weeks (around 07/27/2019) for nurse visit, check BP - and get labs .                                                 ################################################# ################################################# ################################################# #################################################    Current Meds  Medication Sig  . albuterol (VENTOLIN HFA) 108 (90 Base) MCG/ACT inhaler Inhale 1-2 puffs into the lungs every 4 (four) hours as needed for wheezing (FOR EMERGENCY USE).  Marland Kitchen beclomethasone (QVAR) 80 MCG/ACT inhaler Inhale 2 puffs into the lungs 2 (two) times daily. EVERY DAY FOR MAINTENANCE  . benazepril (LOTENSIN) 40 MG tablet Take 1 tablet (40 mg total) by mouth daily.  . budesonide-formoterol (SYMBICORT) 80-4.5 MCG/ACT inhaler Inhale 2 puffs into the lungs 2 (two) times daily.  Marland Kitchen dexlansoprazole (DEXILANT) 60 MG capsule Take 1 capsule (60 mg total) by mouth daily.  Marland Kitchen escitalopram (LEXAPRO) 10 MG tablet Take 1 tablet (10 mg total) by mouth daily.  . hydrochlorothiazide (HYDRODIURIL) 25 MG tablet Take 1 tablet (25 mg total) by mouth daily.  Marland Kitchen oxybutynin (DITROPAN) 5 MG tablet Take 1 tablet (5 mg total) by mouth 3 (three) times daily.  . tamsulosin (FLOMAX) 0.4 MG CAPS capsule Take 1 capsule (0.4 mg total) by mouth daily.  . [DISCONTINUED] albuterol (VENTOLIN HFA) 108 (90 Base) MCG/ACT inhaler Inhale 1-2 puffs into the lungs every 4 (four) hours as needed for wheezing (FOR EMERGENCY USE).  . [DISCONTINUED] beclomethasone (QVAR) 80 MCG/ACT inhaler Inhale 2 puffs into the lungs 2 (two) times daily. EVERY DAY FOR MAINTENANCE  . [DISCONTINUED] budesonide-formoterol (SYMBICORT) 80-4.5 MCG/ACT inhaler Inhale 2 puffs into the lungs 2 (two) times daily.    No Known  Allergies     Review of Systems:  Constitutional: No recent illness  HEENT: No  headache, no vision change  Cardiac: No  chest pain, No  pressure, No palpitations  Respiratory:  No  shortness of breath. No  Cough  Gastrointestinal: No  abdominal pain, no change on bowel habits  Musculoskeletal: +new myalgia/arthralgia  Neurologic: No  weakness, No  Dizziness  Psychiatric: No  concerns with depression, No  concerns with anxiety  Exam:  BP (!) 169/106 (BP Location: Left Arm, Patient Position: Sitting, Cuff Size: Normal)   Pulse 60   Temp 98 F (36.7 C) (Oral)   Wt 186 lb 1.9 oz (84.4 kg)   BMI 29.14 kg/m   Constitutional: VS see above. General Appearance: alert, well-developed, well-nourished, NAD  Eyes: Normal lids and conjunctive, non-icteric sclera  Ears, Nose, Mouth,  Throat: MMM, Normal external inspection ears/nares/mouth/lips/gums.  Neck: No masses, trachea midline.   Respiratory: Normal respiratory effort. no wheeze, no rhonchi, no rales  Musculoskeletal: Gait normal. Symmetric and independent movement of all extremities, neg FABER/FADIR  Abdominal: non-tender, non-distended, no appreciable organomegaly, neg Murphy's, BS [id number]  Neurological: Normal balance/coordination. No tremor.  Skin: warm, dry, intact.   Psychiatric: Normal judgment/insight. Normal mood and affect. Oriented x3.       Visit summary with medication list and pertinent instructions was printed for patient to review, patient was advised to alert [place] if any updates are needed. All questions at time of visit were answered - patient instructed to contact office with any additional concerns. ER/RTC precautions were reviewed with the patient and understanding verbalized.   Note: Total time spent 25 minutes, greater than 50% of the visit was spent face-to-face counseling and coordinating care for the following: The primary encounter diagnosis was Essential hypertension. Diagnoses of  Gastroesophageal reflux disease without esophagitis, Prostate cancer ([place]), Generalized anxiety disorder, and Elevated liver enzymes were also pertinent to this visit.Marland Kitchen  Please note: voice recognition software was used to produce this document, and typos may escape review. Please contact Dr. Sheppard Coil for any needed clarifications.    Follow up plan: Return in about 2 weeks (around 07/27/2019) for nurse visit, check BP - and get labs .

## 2019-07-13 NOTE — Patient Instructions (Addendum)
Ill recheck blood pressure w/ a nurse in 2 weeks Continue the Benazepril and the Hydrochlorothiazide I have added Metoprolol for blood pressure  Will recheck blood work to follow up on liver tests  Instead of Pantoprazole, I've sent in Famotidine aka Pepcid Take this with the Omeprazole aka Dexilant

## 2019-07-22 ENCOUNTER — Ambulatory Visit (INDEPENDENT_AMBULATORY_CARE_PROVIDER_SITE_OTHER): Payer: Managed Care, Other (non HMO) | Admitting: Physician Assistant

## 2019-07-22 ENCOUNTER — Other Ambulatory Visit: Payer: Self-pay

## 2019-07-22 VITALS — BP 153/95 | HR 63

## 2019-07-22 DIAGNOSIS — I10 Essential (primary) hypertension: Secondary | ICD-10-CM

## 2019-07-22 MED ORDER — AMLODIPINE BESYLATE 2.5 MG PO TABS: 2.5000 mg | ORAL_TABLET | Freq: Every day | ORAL | 0 refills | Status: DC

## 2019-07-22 NOTE — Progress Notes (Signed)
Patient presents to clinic for a blood pressure check. Patient brought his own cuff in and it was 186/117. Luvenia Starch was made aware. I let the patient sit for a few minutes and the blood pressure came down a few points. It was 153/95.    Per Luvenia Starch she wants to add amlodipine 2.5 mg and have patient come back in 3 weeks with a provider. I had the patient make a follow up before he left. He is going to get labs done as well while he is here. He did not have any questions.

## 2019-07-23 LAB — COMPLETE METABOLIC PANEL WITH GFR
AG Ratio: 2.1 (calc) (ref 1.0–2.5)
ALT: 189 U/L — ABNORMAL HIGH (ref 9–46)
AST: 78 U/L — ABNORMAL HIGH (ref 10–40)
Albumin: 4.5 g/dL (ref 3.6–5.1)
Alkaline phosphatase (APISO): 101 U/L (ref 36–130)
BUN: 16 mg/dL (ref 7–25)
CO2: 28 mmol/L (ref 20–32)
Calcium: 9.6 mg/dL (ref 8.6–10.3)
Chloride: 104 mmol/L (ref 98–110)
Creat: 0.9 mg/dL (ref 0.60–1.35)
GFR, Est African American: 116 mL/min/{1.73_m2} (ref >=60)
GFR, Est Non African American: 100 mL/min/{1.73_m2} (ref >=60)
Globulin: 2.1 g/dL (calc) (ref 1.9–3.7)
Glucose, Bld: 79 mg/dL (ref 65–99)
Potassium: 3.4 mmol/L — ABNORMAL LOW (ref 3.5–5.3)
Sodium: 140 mmol/L (ref 135–146)
Total Bilirubin: 0.5 mg/dL (ref 0.2–1.2)
Total Protein: 6.6 g/dL (ref 6.1–8.1)

## 2019-07-23 LAB — CBC
HCT: 37.8 % — ABNORMAL LOW (ref 38.5–50.0)
Hemoglobin: 12.9 g/dL — ABNORMAL LOW (ref 13.2–17.1)
MCH: 29.5 pg (ref 27.0–33.0)
MCHC: 34.1 g/dL (ref 32.0–36.0)
MCV: 86.3 fL (ref 80.0–100.0)
MPV: 10.8 fL (ref 7.5–12.5)
Platelets: 171 10*3/uL (ref 140–400)
RBC: 4.38 10*6/uL (ref 4.20–5.80)
RDW: 12.7 % (ref 11.0–15.0)
WBC: 3.7 10*3/uL — ABNORMAL LOW (ref 3.8–10.8)

## 2019-07-25 ENCOUNTER — Encounter: Payer: Self-pay | Admitting: Physician Assistant

## 2019-08-24 ENCOUNTER — Encounter: Payer: Self-pay | Admitting: Osteopathic Medicine

## 2019-08-24 ENCOUNTER — Ambulatory Visit (INDEPENDENT_AMBULATORY_CARE_PROVIDER_SITE_OTHER): Payer: Managed Care, Other (non HMO) | Admitting: Osteopathic Medicine

## 2019-08-24 VITALS — BP 147/91 | HR 65 | Temp 98.0°F | Wt 194.1 lb

## 2019-08-24 DIAGNOSIS — I10 Essential (primary) hypertension: Secondary | ICD-10-CM | POA: Diagnosis not present

## 2019-08-24 NOTE — Progress Notes (Signed)
Nathan Lang is a 49 y.o. male who presents to  [place] at [facility]  today, 08/24/19, seeking care for the following:  The encounter diagnosis was Essential hypertension.    ASSESSMENT & PLAN with other pertinent history/findings:  HTN not at goal  Recently added amlodipine 2.5 mg by one of my colleagues 07/22/19 Also on benazepril 40 mg daily, metoprolol XL 25 mg daily, hctz 25 mg daily Bring pill bottles Not sure if he's taking everything  BP Readings from Last 3 Encounters:  08/24/19 (!) 147/91  07/22/19 (!) 153/95  07/13/19 (!) 169/106   Discussed medication adherence OK to take everything qhs if desired     Patient Instructions  BLOOD PRESSURE: Metoprolol 25 mg daily Amlodipine 2.5 mg daily - INCREASE TO 2 TABLETS A DAY  Benazepril 40 mg daily Hydrochlorothiazide 25 mg daily   BLADDER/PROSTATE: Tamsulosin 0.4 mg daily Oxybutinin 5 mg daily  STOMACH: Famotidine 40 mg daily Dexlansoprazole 60 mg daily       Follow-up instructions: Return in about 1 week (around 08/31/2019) for NURSE VISIT - RECHECK BLOOD PRESSURE ON INCREASED DOSE AMLODIPINE.                    BP (!) 147/91 (BP Location: Left Arm, Patient Position: Sitting, Cuff Size: Normal)   Pulse 65   Temp 98 F (36.7 C) (Oral)   Wt 194 lb 1.9 oz (88.1 kg)   BMI 30.40 kg/m   Current Meds  Medication Sig  . albuterol (VENTOLIN HFA) 108 (90 Base) MCG/ACT inhaler Inhale 1-2 puffs into the lungs every 4 (four) hours as needed for wheezing (FOR EMERGENCY USE).  Marland Kitchen amLODipine (NORVASC) 2.5 MG tablet Take 1 tablet (2.5 mg total) by mouth daily.  . beclomethasone (QVAR) 80 MCG/ACT inhaler Inhale 2 puffs into the lungs 2 (two) times daily. EVERY DAY FOR MAINTENANCE  . benazepril (LOTENSIN) 40 MG tablet Take 1 tablet (40 mg total) by mouth daily.  . budesonide-formoterol (SYMBICORT) 80-4.5 MCG/ACT inhaler Inhale 2 puffs into the lungs 2 (two)  times daily.  Marland Kitchen dexlansoprazole (DEXILANT) 60 MG capsule Take 1 capsule (60 mg total) by mouth daily.  Marland Kitchen escitalopram (LEXAPRO) 10 MG tablet Take 1 tablet (10 mg total) by mouth daily.  . famotidine (PEPCID) 40 MG tablet Take 1 tablet (40 mg total) by mouth daily.  . hydrochlorothiazide (HYDRODIURIL) 25 MG tablet Take 1 tablet (25 mg total) by mouth daily.  . metoprolol succinate (TOPROL-XL) 25 MG 24 hr tablet Take 1 tablet (25 mg total) by mouth daily.  Marland Kitchen oxybutynin (DITROPAN) 5 MG tablet Take 1 tablet (5 mg total) by mouth 3 (three) times daily.  . tamsulosin (FLOMAX) 0.4 MG CAPS capsule Take 1 capsule (0.4 mg total) by mouth daily.    No results found for this or any previous visit (from the past 72 hour(s)).  No results found.  Depression screen [facility] 2/9 06/17/2017 05/04/2017  Decreased Interest 0 0  Down, Depressed, Hopeless 0 1  PHQ - 2 Score 0 1  Altered sleeping 0 -  Tired, decreased energy 1 -  Change in appetite 0 -  Feeling bad or failure about yourself  0 -  Trouble concentrating 1 -  Moving slowly or fidgety/restless 0 -  Suicidal thoughts 0 -  PHQ-9 Score 2 -  Difficult doing work/chores Somewhat difficult -    GAD 7 : Generalized Anxiety Score 06/17/2017  Nervous, Anxious, on Edge 0  Control/stop worrying 0  Worry too much - different things 0  Trouble relaxing 2  Restless 1  Easily annoyed or irritable 0  Afraid - awful might happen 1  Total GAD 7 Score 4  Anxiety Difficulty Somewhat difficult      All questions at time of visit were answered - patient instructed to contact office with any additional concerns or updates.  ER/RTC precautions were reviewed with the patient.  Please note: voice recognition software was used to produce this document, and typos may escape review. Please contact Dr. Sheppard Coil for any needed clarifications.   Total encounter time: 30 minutes.

## 2019-08-24 NOTE — Patient Instructions (Addendum)
BLOOD PRESSURE: Metoprolol 25 mg daily Amlodipine 2.5 mg daily - INCREASE TO 2 TABLETS A DAY  Benazepril 40 mg daily Hydrochlorothiazide 25 mg daily   BLADDER/PROSTATE: Tamsulosin 0.4 mg daily Oxybutinin 5 mg daily  STOMACH: Famotidine 40 mg daily Dexlansoprazole 60 mg daily

## 2019-09-02 ENCOUNTER — Other Ambulatory Visit: Payer: Self-pay

## 2019-09-02 ENCOUNTER — Ambulatory Visit (INDEPENDENT_AMBULATORY_CARE_PROVIDER_SITE_OTHER): Payer: Managed Care, Other (non HMO) | Admitting: Family Medicine

## 2019-09-02 VITALS — BP 154/89 | HR 66 | Temp 98.3°F

## 2019-09-02 DIAGNOSIS — I10 Essential (primary) hypertension: Secondary | ICD-10-CM

## 2019-09-02 IMAGING — DX DG CHEST 2V
2 series · 2 of 2 positions shown · non-contrast
Comparison: None.

CLINICAL DATA: 46-year-old male with unintentional weight loss.

EXAM:
CHEST - 2 VIEW

[chest pa]
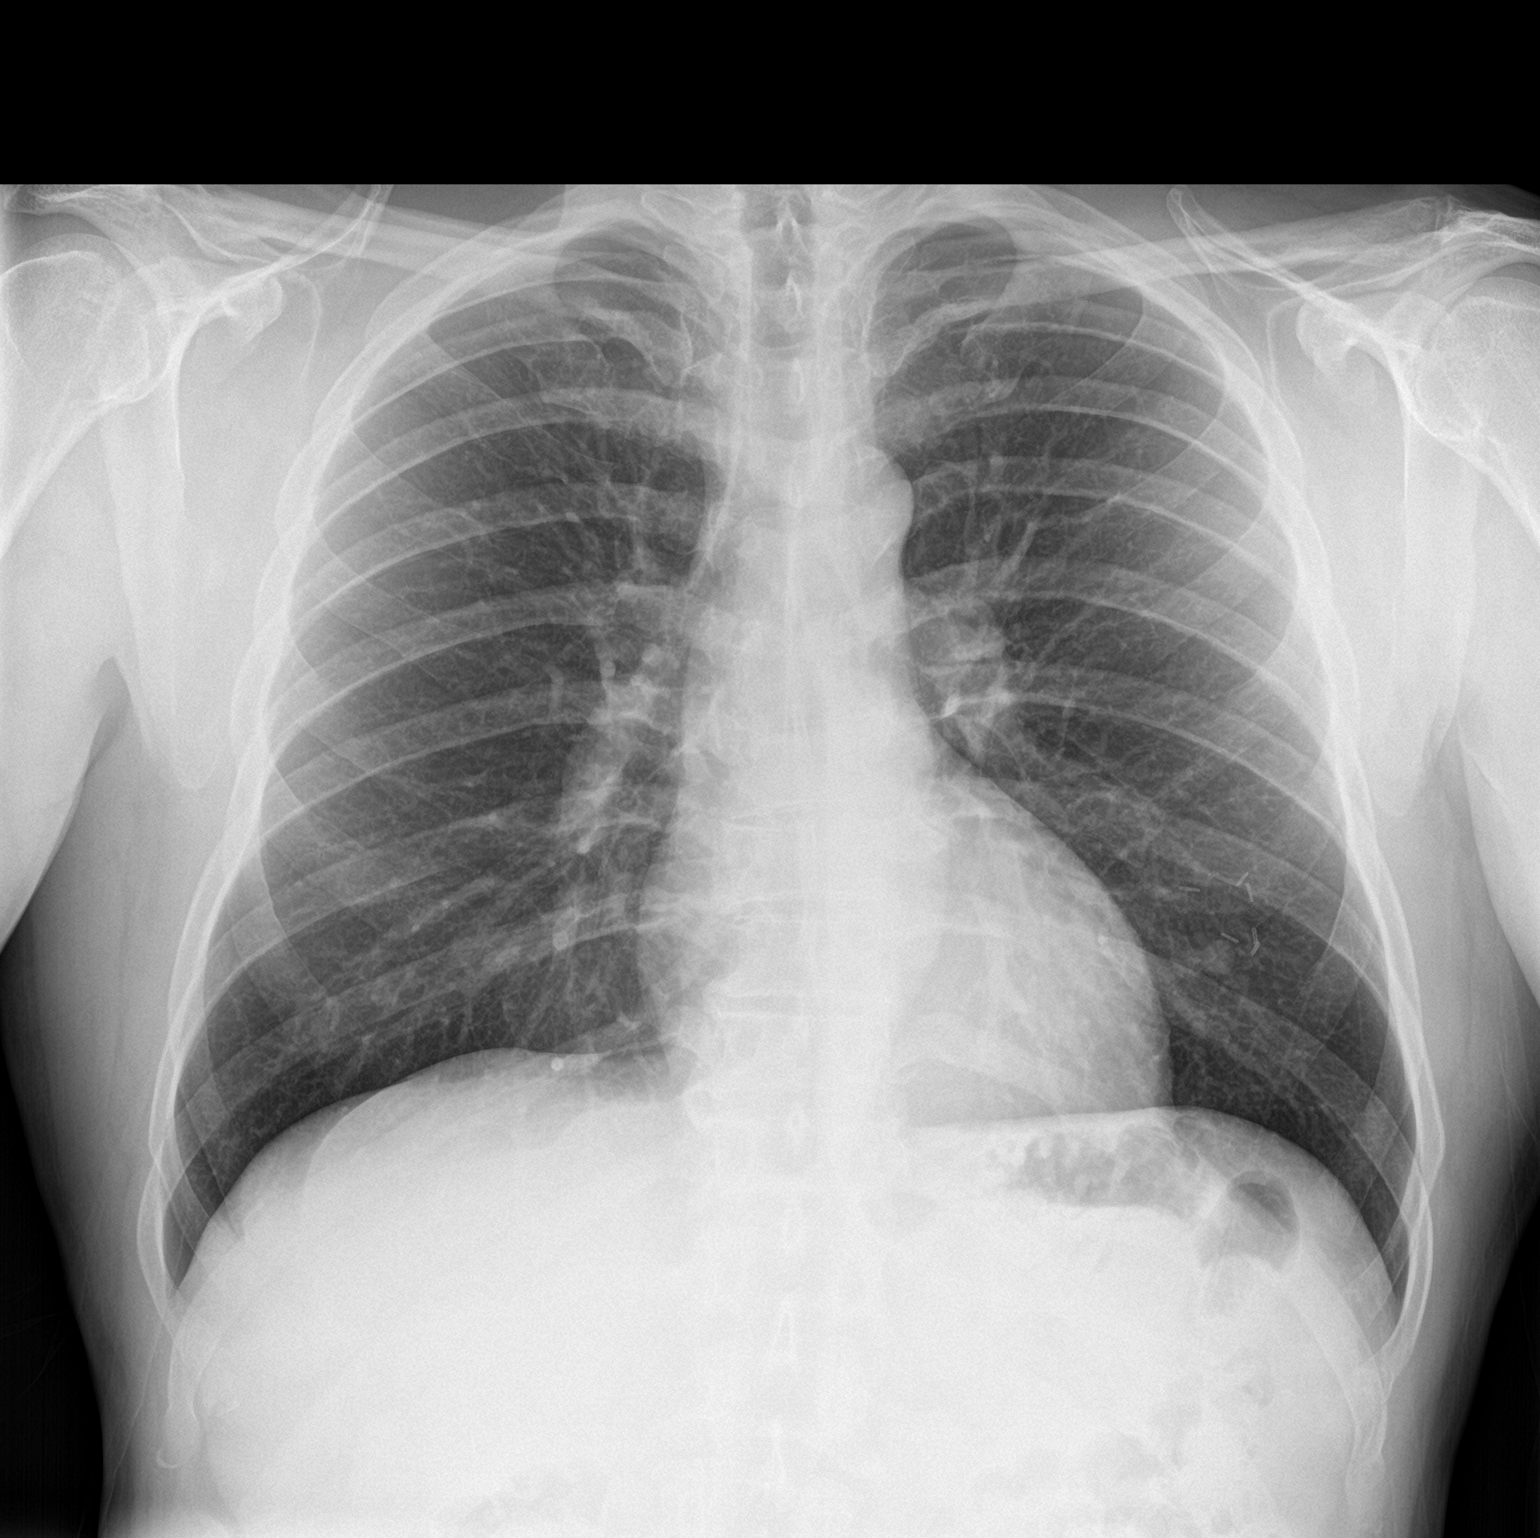

[chest lat]
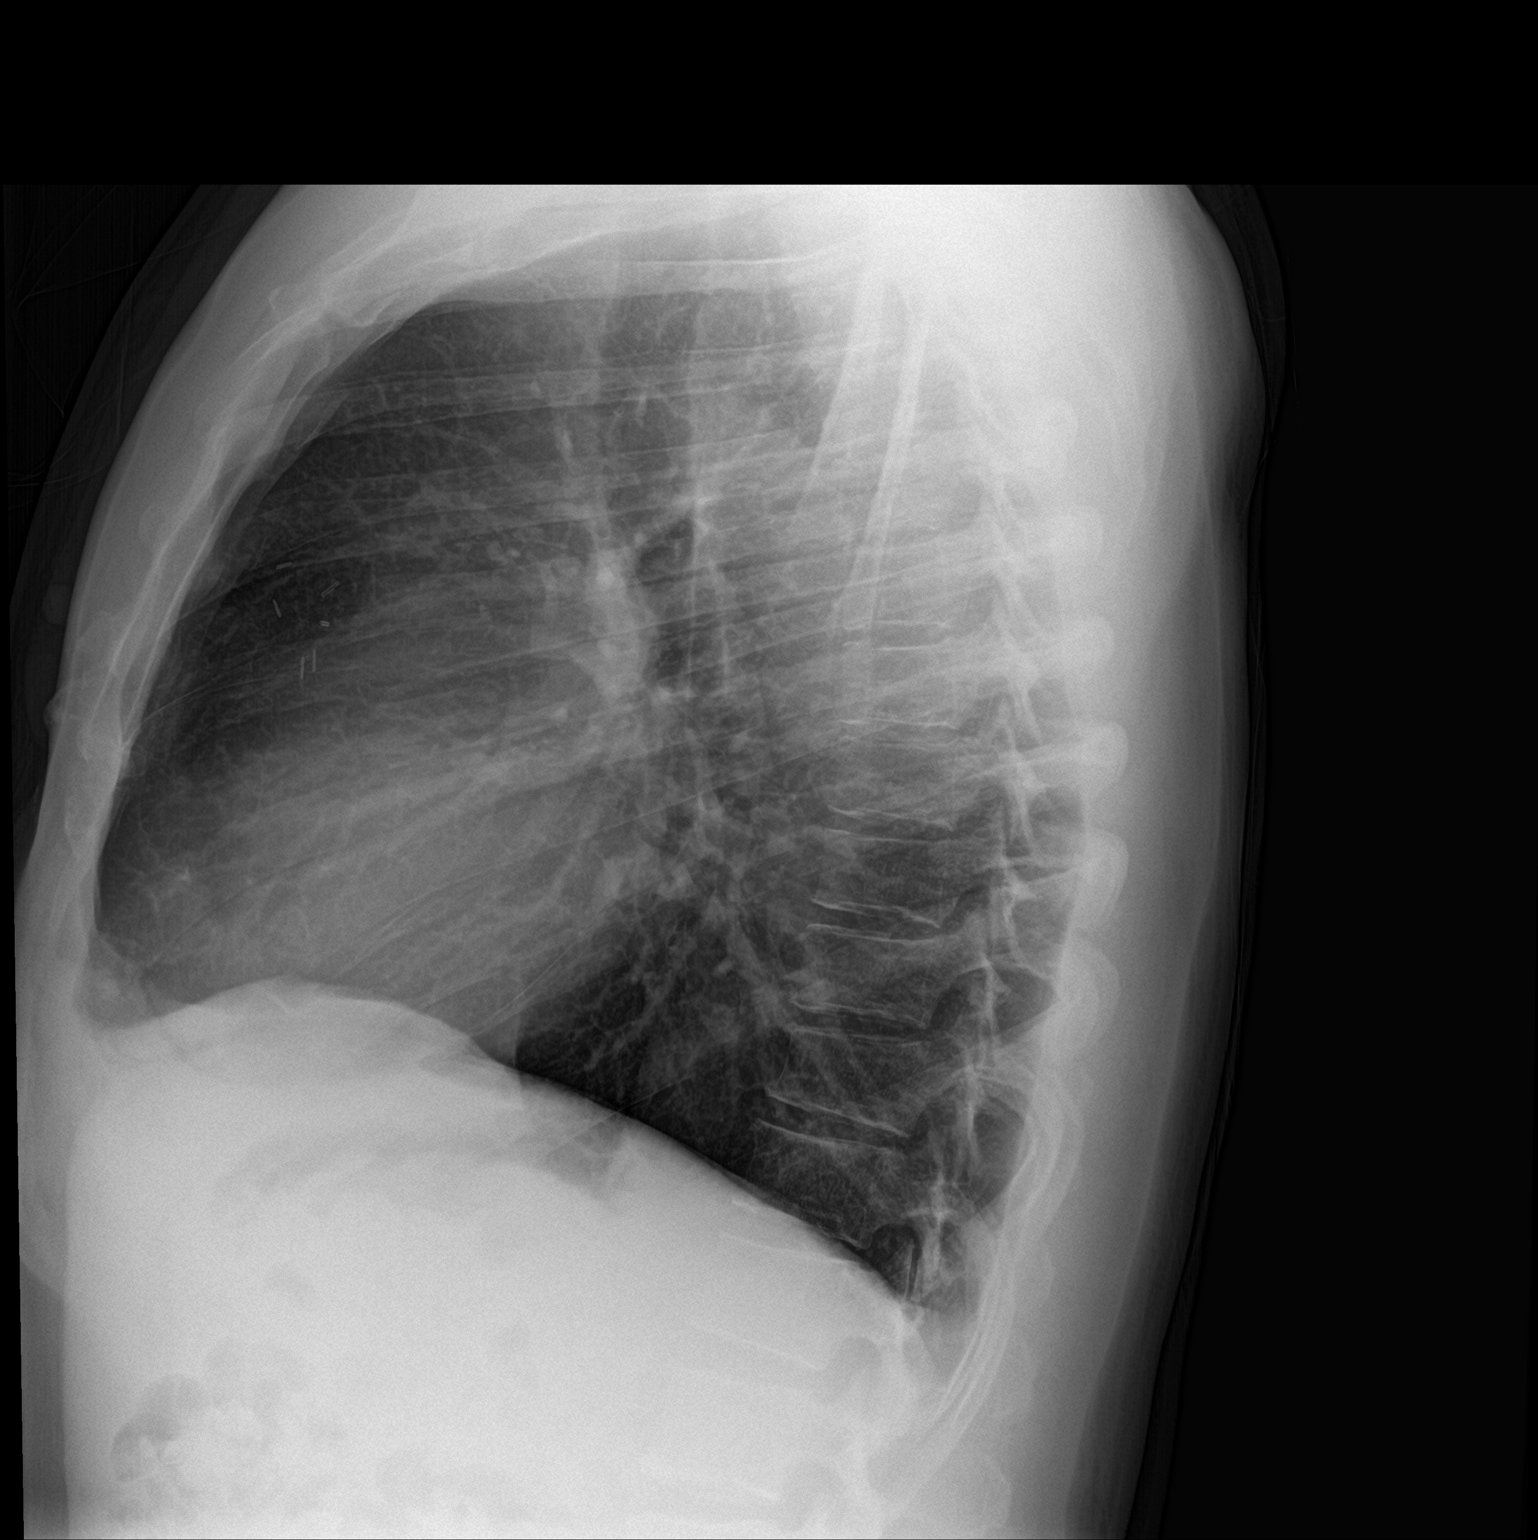

[2 of 2 positions shown; findings below may reference images not displayed]

FINDINGS: Lung volumes are within normal limits. Mediastinal contours are
within normal limits. There are small surgical clips projecting in
the anterior left mid lung. The lungs otherwise appear clear. No
pneumothorax or pleural effusion. Visualized tracheal air column is
within normal limits. No acute osseous abnormality identified.
Negative visible bowel gas pattern.
IMPRESSION: Negative radiographic appearance of the chest chest aside from
clustered surgical clips in the anterior left mid lung.

No acute cardiopulmonary abnormality.

## 2019-09-02 NOTE — Progress Notes (Signed)
Patient presents today as a nurse visit for a BP check per Dr. Sheppard Coil. Patient had an office visit on 08/24/2019 and was instructed to increase the amlodipine 2.5 mg from once a day to twice a day. Patient states he has been taking the metoprolol 25 mg daily, the benazepril 40 mg daily, and hctz 25 mg daily but did not increase the amlodipine to twice a day.   I spoke with Dr. Zigmund Daniel who advised me to instruct the patient to increase the amlodipine 2.5 mg to twice a day. I printed out a copy of the AVS from his 08/24/2019 visit and highlighted all of the instructions regarding his HTN meds. Advised him to stop at the front and schedule an appointment for a nurse visit in 1 week to check his BP after increasing the amlodipine. Patient verbalized understanding and did not have any further questions or concerns at this time.

## 2019-09-04 NOTE — Progress Notes (Signed)
Medical screening examination/treatment was performed by qualified clinical staff member and as supervising physician I was immediately available for consultation/collaboration. I have reviewed documentation and agree with assessment and plan.  Torrence Hammack, DO  

## 2019-09-09 ENCOUNTER — Other Ambulatory Visit: Payer: Self-pay

## 2019-09-09 MED ORDER — AMLODIPINE BESYLATE 2.5 MG PO TABS: 2.5000 mg | ORAL_TABLET | Freq: Two times a day (BID) | ORAL | 0 refills | Status: DC

## 2019-09-12 ENCOUNTER — Ambulatory Visit: Payer: Managed Care, Other (non HMO)

## 2019-09-15 ENCOUNTER — Ambulatory Visit (INDEPENDENT_AMBULATORY_CARE_PROVIDER_SITE_OTHER): Payer: Managed Care, Other (non HMO) | Admitting: Osteopathic Medicine

## 2019-09-15 ENCOUNTER — Other Ambulatory Visit: Payer: Self-pay

## 2019-09-15 VITALS — BP 136/83 | HR 56 | Ht 67.0 in | Wt 194.0 lb

## 2019-09-15 DIAGNOSIS — I10 Essential (primary) hypertension: Secondary | ICD-10-CM
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
## 2019-09-30 ENCOUNTER — Other Ambulatory Visit: Payer: Self-pay

## 2019-09-30 ENCOUNTER — Ambulatory Visit (INDEPENDENT_AMBULATORY_CARE_PROVIDER_SITE_OTHER): Payer: Managed Care, Other (non HMO) | Admitting: Family Medicine

## 2019-09-30 ENCOUNTER — Encounter: Payer: Self-pay | Admitting: Family Medicine

## 2019-09-30 VITALS — BP 164/87 | HR 61 | Ht 67.0 in | Wt 190.0 lb

## 2019-09-30 DIAGNOSIS — R8290 Unspecified abnormal findings in urine: Secondary | ICD-10-CM

## 2019-09-30 DIAGNOSIS — R319 Hematuria, unspecified: Secondary | ICD-10-CM

## 2019-09-30 LAB — POCT URINALYSIS DIP (CLINITEK)
Bilirubin, UA: NEGATIVE
Glucose, UA: NEGATIVE mg/dL
Ketones, POC UA: NEGATIVE mg/dL
Leukocytes, UA: NEGATIVE
Nitrite, UA: NEGATIVE
POC PROTEIN,UA: NEGATIVE
Spec Grav, UA: >=1.03 — AB (ref 1.010–1.025)
Urobilinogen, UA: 0.2 E.U./dL
pH, UA: 6.5 (ref 5.0–8.0)

## 2019-09-30 NOTE — Patient Instructions (Addendum)
We'll call you with results of the urine test. If there is an infection I will call in antibiotics If it doesn't look like infection I am going to order a CT scan of your abdomen and pelvis.     Hematuria en adultos Hematuria, Adult La hematuria es la presencia de sangre en la orina. La sangre puede ser visible en la orina o se puede identificar con un anlisis. La causa de esta afeccin puede ser infecciones de la vejiga, la uretra, el rin o la prstata. Otras causas posibles incluyen lo siguiente:  Clculos en el rin.  Cncer de las vas urinarias.  Exceso de calcio en la orina.  Enfermedades que se transmiten de padres a hijos (enfermedades hereditarias).  Ejercicios que Dealer. Normalmente, las infecciones pueden tratarse con medicamentos, y los clculos renales, por lo general, se eliminarn a travs de la orina. Si ninguna de estas es la causa de la hematuria, quizs sea Chartered loss adjuster ms estudios para identificar la causa de los sntomas que presenta. Es muy importante que le informe a su mdico si ve sangre en la orina, incluso si no tiene dolor o si el sangrado desaparece sin tratamiento. La sangre en la orina, que desaparece y vuelve a Arts administrator, puede ser un sntoma de una enfermedad muy grave, incluido Science writer. No se manifiesta dolor en las etapas iniciales de muchos tipos de cncer urinarios. Siga estas indicaciones en su casa: Medicamentos  [employer] de venta libre y los recetados solamente como se lo haya indicado el mdico.  Si le recetaron un antibitico, tmelo como se lo haya indicado el mdico. No deje de tomar el antibitico aunque comience a sentirse mejor. Comida y bebida  Beba suficiente lquido para mantener la orina clara o de color amarillo plido. Se recomienda que beba de 3a4cuartos de galn (de 2,8a3,8l) por da. Si le han diagnosticado una infeccin, se recomienda beber jugo de [place], adems de grandes cantidades  de agua.  Evite la cafena, el t y las bebidas gaseosas. Estas sustancias tienden a Medical illustrator.  Evite el alcohol, ya que puede irritar la prstata (hombres). Instrucciones generales  Si le han diagnosticado clculos renales, siga las instrucciones de su mdico con respecto a filtrar la orina para Research scientist (medical) clculo.  Vace la vejiga con frecuencia. Evite retener la orina durante largos perodos.  Si es mujer: ? Despus de una deposicin, higiencese desde adelante hacia atrs y use cada trozo de papel higinico por nica vez. ? Vaciar la vejiga antes y despus de Clinical biochemist.  Est atento a cualquier cambio en los sntomas. Informe al mdico sobre cualquier cambio o sntoma nuevo.  Es su responsabilidad retirar el resultado del [place]. Consulte a su mdico o en el departamento donde se realice el estudio cundo estarn [employer].  Concurra a todas las visitas de seguimiento como se lo haya indicado el mdico. Esto es importante. Comunquese con un mdico si:  Siente dolor en la espalda.  Tiene fiebre.  Tiene nuseas o vmitos.  Los sntomas no mejoran despus de 3das.  Los sntomas empeoran. Solicite ayuda de inmediato si:  Presenta muchos vmitos y no puede tomar los medicamentos sin vomitar.  Siente dolor intenso en la espalda o el abdomen, a pesar de [employer].  Tiene una gran cantidad de sangre en la orina.  Despide cogulos de [employer].  Se siente muy dbil o como si fuera a desmayarse.  Se desmaya.  Resumen  La hematuria es la presencia de sangre en la orina. Puede tener muchas causas posibles.  Es muy importante que le informe a su mdico si ve sangre en la orina, incluso si no tiene dolor o si el sangrado desaparece sin tratamiento.  Tome los medicamentos de venta libre y los recetados solamente como se lo haya indicado el mdico.  Beba suficiente lquido para Theatre manager la orina clara o de  color amarillo plido. Esta informacin no tiene Marine scientist el consejo del mdico. Asegrese de hacerle al mdico cualquier pregunta que tenga. Document Revised: 02/23/2017 Document Reviewed: 02/23/2017 Elsevier Patient Education  [place].

## 2019-09-30 NOTE — Progress Notes (Signed)
Nathan Lang - 49 y.o. male MRN [id number]  Date of birth: [birth year]  Subjective Chief Complaint  Patient presents with  . Hematuria    HPI Nathan Lang is a 49 y.o. male with history of prostate cancer s/p prostatectomy here today with complaint of hematuria.  Reports that over the past few days he has noticed blood in his urine.  Some occasional stringy clots in urine.  He denies pain, fever, chills, flank pain, frequency or trouble passing urine.  He had PSA checked in 08/2019 and <0.1.  He has contacted his urologist but could not be seen until May.   ROS:  A comprehensive ROS was completed and negative except as noted per HPI  No Known Allergies  Past Medical History:  Diagnosis Date  . Asthma 12/02/2013  . BPH (benign prostatic hyperplasia) 03/06/2017  . ED (erectile dysfunction) 03/06/2017  . GERD (gastroesophageal reflux disease) 05/05/2017  . Hypertension   . Hypertension 05/05/2017  . Prostate cancer ([place]) 09/14/2018    Past Surgical History:  Procedure Laterality Date  . HERNIA REPAIR    . lung mass exc Left    no records available at this time, clips noted on CXR, pt reports benign mass was removed    Social History   Socioeconomic History  . Marital status: Married    Spouse name: Not on file  . Number of children: Not on file  . Years of education: Not on file  . Highest education level: Not on file  Occupational History  . Not on file  Tobacco Use  . Smoking status: Current Some Day Smoker    Types: Cigarettes  . Smokeless tobacco: Never Used  Substance and Sexual Activity  . Alcohol use: Yes  . Drug use: No  . Sexual activity: Yes    Partners: Female    Birth control/protection: None  Other Topics Concern  . Not on file  Social History Narrative  . Not on file   Social Determinants of Health   Financial Resource Strain:   . Difficulty of Paying Living Expenses:   Food Insecurity:   . Worried About Charity fundraiser in the Last Year:   .  Arboriculturist in the Last Year:   Transportation Needs:   . Film/video editor (Medical):   Marland Kitchen Lack of Transportation (Non-Medical):   Physical Activity:   . Days of Exercise per Week:   . Minutes of Exercise per Session:   Stress:   . Feeling of Stress :   Social Connections:   . Frequency of Communication with Friends and Family:   . Frequency of Social Gatherings with Friends and Family:   . Attends Religious Services:   . Active Member of Clubs or Organizations:   . Attends Archivist Meetings:   Marland Kitchen Marital Status:     Family History  Problem Relation Age of Onset  . Hypertension Mother   . Hypertension Brother     Health Maintenance  Topic Date Due  . TETANUS/TDAP  12/09/2028  . INFLUENZA VACCINE  Completed  . HIV Screening  Completed     ----------------------------------------------------------------------------------------------------------------------------------------------------------------------------------------------------------------- Physical Exam There were no vitals taken for this visit.  Physical Exam Constitutional:      Appearance: Normal appearance.  HENT:     Head: Normocephalic and atraumatic.  Eyes:     General: No scleral icterus. Cardiovascular:     Rate and Rhythm: Normal rate and regular rhythm.  Pulmonary:  Effort: Pulmonary effort is normal.     Breath sounds: Normal breath sounds.  Abdominal:     Tenderness: There is no right CVA tenderness or left CVA tenderness.  Neurological:     Mental Status: He is alert.  Psychiatric:        Mood and Affect: Mood normal.     ------------------------------------------------------------------------------------------------------------------------------------------------------------------------------------------------------------------- Assessment and Plan  Hematuria Hematuria is painless.  Recent PSA undetectable. UA with large blood.  Will send urine for culture.   If  culture is negative will proceed with CT abdomen/pelvis   No orders of the defined types were placed in this encounter.   No follow-ups on file.    This visit occurred during the SARS-CoV-2 public health emergency.  Safety protocols were in place, including screening questions prior to the visit, additional usage of staff PPE, and extensive cleaning of exam room while observing appropriate contact time as indicated for disinfecting solutions.

## 2019-10-02 ENCOUNTER — Encounter: Payer: Self-pay | Admitting: Family Medicine

## 2019-10-02 DIAGNOSIS — R319 Hematuria, unspecified: Secondary | ICD-10-CM | POA: Insufficient documentation

## 2019-10-02 NOTE — Assessment & Plan Note (Addendum)
Hematuria is painless.  Recent PSA undetectable. UA with large blood.  Will send urine for culture.   If culture is negative will proceed with CT abdomen/pelvis

## 2019-10-03 ENCOUNTER — Other Ambulatory Visit: Payer: Self-pay | Admitting: Family Medicine

## 2019-10-03 DIAGNOSIS — R310 Gross hematuria: Secondary | ICD-10-CM

## 2019-10-03 DIAGNOSIS — C61 Malignant neoplasm of prostate: Secondary | ICD-10-CM

## 2019-10-03 LAB — URINE CULTURE
MICRO NUMBER:: 10271167
SPECIMEN QUALITY:: ADEQUATE

## 2019-10-04 ENCOUNTER — Other Ambulatory Visit: Payer: Self-pay | Admitting: Family Medicine

## 2019-10-04 DIAGNOSIS — I10 Essential (primary) hypertension: Secondary | ICD-10-CM

## 2019-10-04 NOTE — Progress Notes (Signed)
Labs needed for CT with contrast

## 2019-10-07 ENCOUNTER — Other Ambulatory Visit: Payer: Self-pay

## 2019-10-07 DIAGNOSIS — I10 Essential (primary) hypertension: Secondary | ICD-10-CM

## 2019-10-07 MED ORDER — BENAZEPRIL HCL 40 MG PO TABS: 40.0000 mg | ORAL_TABLET | Freq: Every day | ORAL | 0 refills | Status: DC

## 2019-11-15 ENCOUNTER — Telehealth: Payer: Self-pay

## 2019-11-15 NOTE — Telephone Encounter (Signed)
Nathan Lang called and left a message asking about a procedure. I looked the chart and it does look like Dr Zigmund Daniel ordered a CT. I called the phone number she left back. I left a message stating he will need the CT and left the imaging number for them to call to schedule the CT. The PA has already been approved.

## 2019-11-22 LAB — COMPLETE METABOLIC PANEL WITH GFR
AG Ratio: 1.9 (calc) (ref 1.0–2.5)
ALT: 146 U/L — ABNORMAL HIGH (ref 9–46)
AST: 66 U/L — ABNORMAL HIGH (ref 10–40)
Albumin: 4.2 g/dL (ref 3.6–5.1)
Alkaline phosphatase (APISO): 130 U/L (ref 36–130)
BUN/Creatinine Ratio: 23 (calc) — ABNORMAL HIGH (ref 6–22)
BUN: 27 mg/dL — ABNORMAL HIGH (ref 7–25)
CO2: 29 mmol/L (ref 20–32)
Calcium: 9.2 mg/dL (ref 8.6–10.3)
Chloride: 104 mmol/L (ref 98–110)
Creat: 1.15 mg/dL (ref 0.60–1.35)
GFR, Est African American: 86 mL/min/{1.73_m2} (ref >=60)
GFR, Est Non African American: 74 mL/min/{1.73_m2} (ref >=60)
Globulin: 2.2 g/dL (calc) (ref 1.9–3.7)
Glucose, Bld: 154 mg/dL — ABNORMAL HIGH (ref 65–99)
Potassium: 3.4 mmol/L — ABNORMAL LOW (ref 3.5–5.3)
Sodium: 138 mmol/L (ref 135–146)
Total Bilirubin: 0.5 mg/dL (ref 0.2–1.2)
Total Protein: 6.4 g/dL (ref 6.1–8.1)

## 2019-11-22 NOTE — Telephone Encounter (Signed)
I called imaging department and asked them to call patient to schedule CT. I did advise he only speaks spanish.

## 2019-11-25 ENCOUNTER — Other Ambulatory Visit: Payer: Self-pay

## 2019-11-25 ENCOUNTER — Ambulatory Visit (INDEPENDENT_AMBULATORY_CARE_PROVIDER_SITE_OTHER): Payer: Managed Care, Other (non HMO)

## 2019-11-25 DIAGNOSIS — C61 Malignant neoplasm of prostate: Secondary | ICD-10-CM

## 2019-11-25 DIAGNOSIS — R310 Gross hematuria: Secondary | ICD-10-CM | POA: Diagnosis not present

## 2019-11-25 MED ORDER — IOHEXOL 300 MG/ML  SOLN
125.0000 mL | Freq: Once | INTRAMUSCULAR | Status: AC | PRN
  Administered 2019-11-25: 125 mL via INTRAVENOUS

## 2019-11-28 ENCOUNTER — Other Ambulatory Visit: Payer: Self-pay | Admitting: Family Medicine

## 2019-11-28 ENCOUNTER — Other Ambulatory Visit: Payer: Self-pay

## 2019-11-28 ENCOUNTER — Encounter: Payer: Self-pay | Admitting: Family Medicine

## 2019-11-28 DIAGNOSIS — R310 Gross hematuria: Secondary | ICD-10-CM

## 2019-11-28 MED ORDER — FAMOTIDINE 40 MG PO TABS: 40.0000 mg | ORAL_TABLET | Freq: Every day | ORAL | 1 refills | Status: DC

## 2019-12-01 NOTE — Telephone Encounter (Signed)
CT done

## 2019-12-06 ENCOUNTER — Other Ambulatory Visit: Payer: Self-pay

## 2019-12-06 MED ORDER — AMLODIPINE BESYLATE 2.5 MG PO TABS: 2.5000 mg | ORAL_TABLET | Freq: Two times a day (BID) | ORAL | 1 refills | Status: DC

## 2020-01-12 ENCOUNTER — Other Ambulatory Visit: Payer: Self-pay

## 2020-01-12 DIAGNOSIS — I10 Essential (primary) hypertension: Secondary | ICD-10-CM

## 2020-01-13 ENCOUNTER — Other Ambulatory Visit: Payer: Self-pay

## 2020-01-13 DIAGNOSIS — I10 Essential (primary) hypertension: Secondary | ICD-10-CM

## 2020-01-13 MED ORDER — BENAZEPRIL HCL 40 MG PO TABS: 40.0000 mg | ORAL_TABLET | Freq: Every day | ORAL | 0 refills | Status: DC

## 2020-01-17 ENCOUNTER — Other Ambulatory Visit: Payer: Self-pay

## 2020-01-17 DIAGNOSIS — I10 Essential (primary) hypertension: Secondary | ICD-10-CM

## 2020-01-17 MED ORDER — METOPROLOL SUCCINATE ER 25 MG PO TB24: 25.0000 mg | ORAL_TABLET | Freq: Every day | ORAL | 1 refills | Status: DC

## 2020-01-17 MED ORDER — HYDROCHLOROTHIAZIDE 25 MG PO TABS: 25.0000 mg | ORAL_TABLET | Freq: Every day | ORAL | 3 refills | Status: DC

## 2020-02-02 ENCOUNTER — Telehealth: Payer: Self-pay

## 2020-02-02 DIAGNOSIS — K219 Gastro-esophageal reflux disease without esophagitis: Secondary | ICD-10-CM

## 2020-02-02 MED ORDER — DEXLANSOPRAZOLE 60 MG PO CPDR: 60.0000 mg | DELAYED_RELEASE_CAPSULE | Freq: Every day | ORAL | 3 refills | Status: DC

## 2020-02-02 NOTE — Telephone Encounter (Signed)
Patient requesting medication refill. Rx sent to pharmacy.

## 2020-02-20 ENCOUNTER — Other Ambulatory Visit: Payer: Self-pay | Admitting: Osteopathic Medicine

## 2020-02-20 DIAGNOSIS — F411 Generalized anxiety disorder: Secondary | ICD-10-CM

## 2020-02-20 NOTE — Telephone Encounter (Signed)
12/28/18 -last refill Last Ov-  09/15/2019

## 2020-04-02 ENCOUNTER — Other Ambulatory Visit: Payer: Self-pay | Admitting: Osteopathic Medicine

## 2020-05-07 DIAGNOSIS — W881XXA Exposure to radioactive isotopes, initial encounter: Secondary | ICD-10-CM | POA: Insufficient documentation

## 2020-05-07 DIAGNOSIS — N3041 Irradiation cystitis with hematuria: Secondary | ICD-10-CM | POA: Insufficient documentation

## 2020-05-09 ENCOUNTER — Ambulatory Visit (INDEPENDENT_AMBULATORY_CARE_PROVIDER_SITE_OTHER): Payer: Managed Care, Other (non HMO) | Admitting: Osteopathic Medicine

## 2020-05-09 ENCOUNTER — Encounter: Payer: Self-pay | Admitting: Osteopathic Medicine

## 2020-05-09 ENCOUNTER — Other Ambulatory Visit: Payer: Self-pay

## 2020-05-09 VITALS — BP 138/82 | HR 65 | Temp 97.6°F | Wt 194.1 lb

## 2020-05-09 DIAGNOSIS — K219 Gastro-esophageal reflux disease without esophagitis: Secondary | ICD-10-CM

## 2020-05-09 DIAGNOSIS — I10 Essential (primary) hypertension: Secondary | ICD-10-CM

## 2020-05-09 DIAGNOSIS — C61 Malignant neoplasm of prostate: Secondary | ICD-10-CM | POA: Diagnosis not present

## 2020-05-09 DIAGNOSIS — F411 Generalized anxiety disorder: Secondary | ICD-10-CM

## 2020-05-09 MED ORDER — ALBUTEROL SULFATE HFA 108 (90 BASE) MCG/ACT IN AERS
1.0000 | INHALATION_SPRAY | RESPIRATORY_TRACT | 99 refills | Status: DC | PRN
Start: 2020-05-09 — End: 2021-08-30

## 2020-05-09 MED ORDER — HYDROCHLOROTHIAZIDE 25 MG PO TABS: 25.0000 mg | ORAL_TABLET | Freq: Every day | ORAL | 3 refills | Status: DC

## 2020-05-09 MED ORDER — BENAZEPRIL HCL 40 MG PO TABS: 40.0000 mg | ORAL_TABLET | Freq: Every day | ORAL | 3 refills | Status: DC

## 2020-05-09 MED ORDER — ESCITALOPRAM OXALATE 10 MG PO TABS: ORAL_TABLET | ORAL | 3 refills | Status: DC

## 2020-05-09 MED ORDER — DEXLANSOPRAZOLE 60 MG PO CPDR: 60.0000 mg | DELAYED_RELEASE_CAPSULE | Freq: Every day | ORAL | 3 refills | Status: DC

## 2020-05-09 MED ORDER — METOPROLOL SUCCINATE ER 25 MG PO TB24: 25.0000 mg | ORAL_TABLET | Freq: Every day | ORAL | 3 refills | Status: DC

## 2020-05-09 MED ORDER — FAMOTIDINE 40 MG PO TABS
40.0000 mg | ORAL_TABLET | Freq: Every day | ORAL | 3 refills | Status: DC
Start: 2020-05-09 — End: 2020-06-27

## 2020-05-09 MED ORDER — AMLODIPINE BESYLATE 2.5 MG PO TABS
2.5000 mg | ORAL_TABLET | Freq: Every day | ORAL | 3 refills | Status: DC
Start: 2020-05-09 — End: 2020-06-27

## 2020-05-09 MED ORDER — DISTILLED WATER PO LIQD: ORAL | 11 refills | Status: DC

## 2020-05-09 NOTE — Progress Notes (Signed)
Nathan Lang is a 49 y.o. male who presents to  [place] [facility]  today, 05/09/20, seeking care for the following:      BP follow-up  BP Readings from Last 3 Encounters:  05/09/20 138/82  09/30/19 (!) 164/87  09/15/19 136/83   . Needs labs - per pt needed for his psychiatry team, no orders provided. Reviewed labs in care everywhere. CBC 04/17/20 - anemia, CMP 04/11/20 - AST 43 but trending down from 112, ALP mild elevation 128, ALT 100 but trending down from 184  . Requests Rx for distilled water for urinary catheter care      ASSESSMENT & PLAN with other pertinent findings:  The primary encounter diagnosis was Primary hypertension. Diagnoses of Generalized anxiety disorder, Gastroesophageal reflux disease without esophagitis, Essential hypertension, and Prostate cancer ([place]) were also pertinent to this visit.   No results found for this or any previous visit (from the past 24 hour(s)).  There are no Patient Instructions on file for this visit.  Orders Placed This Encounter  Procedures  . CBC  . COMPLETE METABOLIC PANEL WITH GFR  . Lipid panel  . Hemoglobin A1c    Meds ordered this encounter  Medications  . Distilled Water LIQD    Sig: Use as directed for cancer-related treatments    Dispense:  3780 mL    Refill:  11  . metoprolol succinate (TOPROL-XL) 25 MG 24 hr tablet    Sig: Take 1 tablet (25 mg total) by mouth daily.    Dispense:  90 tablet    Refill:  3  . hydrochlorothiazide (HYDRODIURIL) 25 MG tablet    Sig: Take 1 tablet (25 mg total) by mouth daily.    Dispense:  90 tablet    Refill:  3  . famotidine (PEPCID) 40 MG tablet    Sig: Take 1 tablet (40 mg total) by mouth daily.    Dispense:  90 tablet    Refill:  3  . escitalopram (LEXAPRO) 10 MG tablet    Sig: TAKE 1 TABLET(10 MG) BY MOUTH DAILY    Dispense:  90 tablet    Refill:  3  . dexlansoprazole (DEXILANT) 60 MG capsule    Sig: Take 1 capsule  (60 mg total) by mouth daily.    Dispense:  90 capsule    Refill:  3  . benazepril (LOTENSIN) 40 MG tablet    Sig: Take 1 tablet (40 mg total) by mouth daily.    Dispense:  90 tablet    Refill:  3  . albuterol (VENTOLIN HFA) 108 (90 Base) MCG/ACT inhaler    Sig: Inhale 1-2 puffs into the lungs every 4 (four) hours as needed for wheezing (FOR EMERGENCY USE).    Dispense:  18 g    Refill:  [place], THANKS!  Marland Kitchen amLODipine (NORVASC) 2.5 MG tablet    Sig: Take 1 tablet (2.5 mg total) by mouth daily.    Dispense:  180 tablet    Refill:  3       Follow-up instructions: Return in about 6 months (around 11/07/2020) for ANNUAL (call week prior to visit for lab orders).                                         BP 138/82 (BP Location: Left Arm, Patient  Position: Sitting, Cuff Size: Normal)   Pulse 65   Temp 97.6 F (36.4 C) (Oral)   Wt 194 lb 1.9 oz (88.1 kg)   BMI 30.40 kg/m   Current Meds  Medication Sig  . albuterol (VENTOLIN HFA) 108 (90 Base) MCG/ACT inhaler Inhale 1-2 puffs into the lungs every 4 (four) hours as needed for wheezing (FOR EMERGENCY USE).  Marland Kitchen amLODipine (NORVASC) 2.5 MG tablet Take 1 tablet (2.5 mg total) by mouth daily.  . benazepril (LOTENSIN) 40 MG tablet Take 1 tablet (40 mg total) by mouth daily.  Marland Kitchen dexlansoprazole (DEXILANT) 60 MG capsule Take 1 capsule (60 mg total) by mouth daily.  Marland Kitchen escitalopram (LEXAPRO) 10 MG tablet TAKE 1 TABLET(10 MG) BY MOUTH DAILY  . famotidine (PEPCID) 40 MG tablet Take 1 tablet (40 mg total) by mouth daily.  . hydrochlorothiazide (HYDRODIURIL) 25 MG tablet Take 1 tablet (25 mg total) by mouth daily.  . metoprolol succinate (TOPROL-XL) 25 MG 24 hr tablet Take 1 tablet (25 mg total) by mouth daily.  Marland Kitchen oxybutynin (DITROPAN) 5 MG tablet Take 1 tablet (5 mg total) by mouth 3 (three) times daily.  . tamsulosin (FLOMAX) 0.4 MG CAPS capsule Take 1 capsule (0.4 mg total) by mouth  daily.  . [DISCONTINUED] albuterol (VENTOLIN HFA) 108 (90 Base) MCG/ACT inhaler Inhale 1-2 puffs into the lungs every 4 (four) hours as needed for wheezing (FOR EMERGENCY USE).  . [DISCONTINUED] amLODipine (NORVASC) 2.5 MG tablet TAKE 1 TABLET(2.5 MG) BY MOUTH TWICE DAILY  . [DISCONTINUED] beclomethasone (QVAR) 80 MCG/ACT inhaler Inhale 2 puffs into the lungs 2 (two) times daily. EVERY DAY FOR MAINTENANCE  . [DISCONTINUED] benazepril (LOTENSIN) 40 MG tablet Take 1 tablet (40 mg total) by mouth daily.  . [DISCONTINUED] budesonide-formoterol (SYMBICORT) 80-4.5 MCG/ACT inhaler Inhale 2 puffs into the lungs 2 (two) times daily.  . [DISCONTINUED] dexlansoprazole (DEXILANT) 60 MG capsule Take 1 capsule (60 mg total) by mouth daily.  . [DISCONTINUED] escitalopram (LEXAPRO) 10 MG tablet TAKE 1 TABLET(10 MG) BY MOUTH DAILY  . [DISCONTINUED] famotidine (PEPCID) 40 MG tablet Take 1 tablet (40 mg total) by mouth daily.  . [DISCONTINUED] hydrochlorothiazide (HYDRODIURIL) 25 MG tablet Take 1 tablet (25 mg total) by mouth daily.  . [DISCONTINUED] metoprolol succinate (TOPROL-XL) 25 MG 24 hr tablet Take 1 tablet (25 mg total) by mouth daily.    No results found for this or any previous visit (from the past 72 hour(s)).  No results found.     All questions at time of visit were answered - patient instructed to contact office with any additional concerns or updates.  ER/RTC precautions were reviewed with the patient as applicable.   Please note: voice recognition software was used to produce this document, and typos may escape review. Please contact Dr. Sheppard Coil for any needed clarifications.

## 2020-05-10 ENCOUNTER — Encounter: Payer: Self-pay | Admitting: Osteopathic Medicine

## 2020-05-10 LAB — COMPLETE METABOLIC PANEL WITH GFR
AG Ratio: 1.9 (calc) (ref 1.0–2.5)
ALT: 88 U/L — ABNORMAL HIGH (ref 9–46)
AST: 93 U/L — ABNORMAL HIGH (ref 10–35)
Albumin: 3.7 g/dL (ref 3.6–5.1)
Alkaline phosphatase (APISO): 80 U/L (ref 35–144)
BUN: 16 mg/dL (ref 7–25)
CO2: 26 mmol/L (ref 20–32)
Calcium: 8.9 mg/dL (ref 8.6–10.3)
Chloride: 108 mmol/L (ref 98–110)
Creat: 1.07 mg/dL (ref 0.70–1.33)
GFR, Est African American: 93 mL/min/{1.73_m2} (ref >=60)
GFR, Est Non African American: 81 mL/min/{1.73_m2} (ref >=60)
Globulin: 1.9 g/dL (calc) (ref 1.9–3.7)
Glucose, Bld: 97 mg/dL (ref 65–99)
Potassium: 4 mmol/L (ref 3.5–5.3)
Sodium: 140 mmol/L (ref 135–146)
Total Bilirubin: 0.5 mg/dL (ref 0.2–1.2)
Total Protein: 5.6 g/dL — ABNORMAL LOW (ref 6.1–8.1)

## 2020-05-10 LAB — CBC
HCT: 21.8 % — ABNORMAL LOW (ref 38.5–50.0)
Hemoglobin: 7.2 g/dL — ABNORMAL LOW (ref 13.2–17.1)
MCH: 29.8 pg (ref 27.0–33.0)
MCHC: 33 g/dL (ref 32.0–36.0)
MCV: 90.1 fL (ref 80.0–100.0)
MPV: 11.2 fL (ref 7.5–12.5)
Platelets: 182 10*3/uL (ref 140–400)
RBC: 2.42 10*6/uL — ABNORMAL LOW (ref 4.20–5.80)
RDW: 13 % (ref 11.0–15.0)
WBC: 3.9 10*3/uL (ref 3.8–10.8)

## 2020-05-10 LAB — LIPID PANEL
Cholesterol: 158 mg/dL (ref <200)
HDL: 35 mg/dL — ABNORMAL LOW (ref >=40)
LDL Cholesterol (Calc): 99 mg/dL (calc)
Non-HDL Cholesterol (Calc): 123 mg/dL (calc) (ref <130)
Total CHOL/HDL Ratio: 4.5 (calc) (ref <5.0)
Triglycerides: 142 mg/dL (ref <150)

## 2020-05-10 LAB — HEMOGLOBIN A1C
Hgb A1c MFr Bld: 4.9 % of total Hgb (ref <5.7)
Mean Plasma Glucose: 94 (calc)
eAG (mmol/L): 5.2 (calc)

## 2020-05-11 DIAGNOSIS — T839XXA Unspecified complication of genitourinary prosthetic device, implant and graft, initial encounter: Secondary | ICD-10-CM | POA: Insufficient documentation

## 2020-05-12 DIAGNOSIS — D62 Acute posthemorrhagic anemia: Secondary | ICD-10-CM | POA: Insufficient documentation

## 2020-06-05 ENCOUNTER — Telehealth: Payer: Self-pay

## 2020-06-05 ENCOUNTER — Ambulatory Visit: Payer: Managed Care, Other (non HMO) | Admitting: Medical-Surgical

## 2020-06-05 NOTE — Telephone Encounter (Signed)
Task completed. Spoke to pt regarding his concerns. Separate message sent to the provider.

## 2020-06-05 NOTE — Telephone Encounter (Signed)
[place] called and would like a call back from [place].

## 2020-06-05 NOTE — Telephone Encounter (Signed)
Needs HFU appt can pu tin acute slot

## 2020-06-05 NOTE — Telephone Encounter (Signed)
Pt called stating that he went to the hospital over the weekend because he was feeling weak. He had his labs drawn and was informed his blood count was at 5. Pt had a blood transfusion (two pints). He mentioned that the catheter was removed and he had a UTI. Currently taking an antibiotic for 7 days/twice daily. Per pt, specialists offices are not able to check his levels. It must be completed with provider. Pt is requesting CBC, urine and culture. Labs pended.

## 2020-06-11 NOTE — Telephone Encounter (Signed)
I left a pt a voicemail to call us back to scheduled a Hosp F/u appt

## 2020-06-13 DIAGNOSIS — F17200 Nicotine dependence, unspecified, uncomplicated: Secondary | ICD-10-CM | POA: Insufficient documentation

## 2020-06-25 ENCOUNTER — Ambulatory Visit (INDEPENDENT_AMBULATORY_CARE_PROVIDER_SITE_OTHER): Payer: Managed Care, Other (non HMO) | Admitting: Nurse Practitioner

## 2020-06-25 ENCOUNTER — Encounter: Payer: Self-pay | Admitting: Nurse Practitioner

## 2020-06-25 VITALS — BP 118/58 | HR 72 | Temp 98.0°F | Ht 67.0 in | Wt 195.9 lb

## 2020-06-25 DIAGNOSIS — K649 Unspecified hemorrhoids: Secondary | ICD-10-CM

## 2020-06-25 DIAGNOSIS — C61 Malignant neoplasm of prostate: Secondary | ICD-10-CM | POA: Diagnosis not present

## 2020-06-25 DIAGNOSIS — Z862 Personal history of diseases of the blood and blood-forming organs and certain disorders involving the immune mechanism: Secondary | ICD-10-CM | POA: Diagnosis not present

## 2020-06-25 DIAGNOSIS — I499 Cardiac arrhythmia, unspecified: Secondary | ICD-10-CM | POA: Diagnosis not present

## 2020-06-25 DIAGNOSIS — I480 Paroxysmal atrial fibrillation: Secondary | ICD-10-CM | POA: Insufficient documentation

## 2020-06-25 MED ORDER — HYDROCORTISONE ACETATE 25 MG RE SUPP: 25.0000 mg | Freq: Two times a day (BID) | RECTAL | 2 refills | Status: DC | PRN

## 2020-06-25 NOTE — Patient Instructions (Addendum)
Your EKG shows that you are in an abnormal heart rhythm. I feel that our best course of action is for you to go to the emergency room for further treatment based on the way your heart is beating right now.    Atrial Flutter  Atrial flutter is a type of abnormal heart rhythm (arrhythmia). The heart has an electrical system that tells it how to beat. In atrial flutter, the signals move rapidly in the top chambers of the heart (the atria). This makes your heart beat very fast. Atrial flutter can come and go, or it can be permanent. The goal of treatment is to prevent blood clots from forming, control your heart rate, or restore your heartbeat to a normal rhythm. If this condition is not treated, it can cause serious problems, such as a weakened heart muscle (cardiomyopathy) or a stroke. What are the causes? This condition is often caused by conditions that damage the heart's electrical system. These include:  Heart conditions and heart surgery. These include heart attacks and open-heart surgery.  Lung problems, such as COPD or a blood clot in the lung (pulmonary embolism, or PE).  Poorly controlled high blood pressure (hypertension).  Overactive thyroid (hyperthyroidism).  Diabetes. In some cases, the cause of this condition is not known. What increases the risk? You are more likely to develop this condition if:  You are an elderly adult.  You are a man.  You are overweight (obese).  You have obstructive sleep apnea.  You have a family history of atrial flutter.  You have diabetes.  You drink a lot of alcohol, especially binge drinking.  You use drugs, including cannabis.  You smoke. What are the signs or symptoms? Symptoms of this condition include:  A feeling that your heart is pounding or racing (palpitations).  Shortness of breath.  Chest pain.  Feeling dizzy or light-headed.  Fainting.  Low blood pressure (hypotension).  Fatigue.  Tiring easily during  exercise or activity. In some cases, there are no symptoms. How is this diagnosed? This condition may be diagnosed with:  An electrocardiogram (ECG) to check electrical signals of the heart.  An ambulatory cardiac monitor to record your heart's activity for a few days.  An echocardiogram to create pictures of your heart.  A transesophageal echocardiogram (TEE) to create even better pictures of your heart.  A stress test to check your blood supply while you exercise.  Imaging tests, such as a CT scan or chest X-ray.  Blood tests. How is this treated? Treatment depends on underlying conditions and how you feel when you experience atrial flutter. This condition may be treated with:  Medicines to prevent blood clots or to treat heart rate or heart rhythm problems.  Electrical cardioversion to reset the heart's rhythm.  Ablation to remove the heart tissue that sends abnormal signals.  Left atrial appendage closure to seal the area where blood clots can form. In some cases, underlying conditions will be treated. Follow these instructions at home: Medicines  Take over-the-counter and prescription medicines only as told by your health care provider.  Do not take any new medicines without talking to your health care provider.  If you are taking blood thinners: ? Talk with your health care provider before you take any medicines that contain aspirin or NSAIDs, such as ibuprofen. These medicines increase your risk for dangerous bleeding. ? Take your medicine exactly as told, at the same time every day. ? Avoid activities that could cause injury or bruising, and  follow instructions about how to prevent falls. ? Wear a medical alert bracelet or carry a card that lists what medicines you take. Lifestyle  Eat heart-healthy foods. Talk with a dietitian to make an eating plan that is right for you.  Do not use any products that contain nicotine or tobacco, such as cigarettes, e-cigarettes,  and chewing tobacco. If you need help quitting, ask your health care provider.  Do not drink alcohol.  Do not use drugs, including cannabis.  Lose weight if you are overweight or obese.  Exercise regularly as instructed by your health care provider. General instructions  Do not use diet pills unless your health care provider approves. Diet pills may make heart problems worse.  If you have obstructive sleep apnea, manage your condition as told by your health care provider.  Keep all follow-up visits as told by your health care provider. This is important. Contact a health care provider if you:  Notice a change in the rate, rhythm, or strength of your heartbeat.  Are taking a blood thinner and you notice more bruising.  Have a sudden change in weight.  Tire more easily when you exercise or do heavy work. Get help right away if you have:  Pain or pressure in your chest.  Shortness of breath.  Fainting.  Increasing sweating with no known cause.  Side effects of blood thinners, such as blood in your vomit, stool, or urine, or bleeding that cannot stop.  Any symptoms of a stroke. "BE FAST" is an easy way to remember the main warning signs of a stroke: ? B - Balance. Signs are dizziness, sudden trouble walking, or loss of balance. ? E - Eyes. Signs are trouble seeing or a sudden change in vision. ? F - Face. Signs are sudden weakness or numbness of the face, or the face or eyelid drooping on one side. ? A - Arms. Signs are weakness or numbness in an arm. This happens suddenly and usually on one side of the body. ? S - Speech. Signs are sudden trouble speaking, slurred speech, or trouble understanding what people say. ? T - Time. Time to call emergency services. Write down what time symptoms started.  Other signs of a stroke, such as: ? A sudden, severe headache with no known cause. ? Nausea or vomiting. ? Seizure.  These symptoms may represent a serious problem that is an  emergency. Do not wait to see if the symptoms will go away. Get medical help right away. Call your local emergency services (911 in the [place] Do not drive yourself to the hospital. Summary  Atrial flutter is an abnormal heart rhythm that can give you symptoms of palpitations, shortness of breath, or fatigue.  Atrial flutter is often treated with medicines to keep your heart in a normal rhythm and to prevent a stroke.  Get help right away if you cannot catch your breath, or have chest pain or pressure.  Get help right away if you have signs or symptoms of a stroke. This information is not intended to replace advice given to you by your health care provider. Make sure you discuss any questions you have with your health care provider. Document Revised: 12/22/2018 Document Reviewed: 12/22/2018 Elsevier Patient Education  [place].    Atrial Fibrillation  Atrial fibrillation is a type of heartbeat that is irregular or fast. If you have this condition, your heart beats without any order. This makes it hard for your heart to pump blood in  a normal way. Atrial fibrillation may come and go, or it may become a long-lasting problem. If this condition is not treated, it can put you at higher risk for stroke, heart failure, and other heart problems. What are the causes? This condition may be caused by diseases that damage the heart. They include:  High blood pressure.  Heart failure.  Heart valve disease.  Heart surgery. Other causes include:  Diabetes.  Thyroid disease.  Being overweight.  Kidney disease. Sometimes the cause is not known. What increases the risk? You are more likely to develop this condition if:  You are older.  You smoke.  You exercise often and very hard.  You have a family history of this condition.  You are a man.  You use drugs.  You drink a lot of alcohol.  You have lung conditions, such as emphysema, pneumonia, or COPD.  You have sleep  apnea. What are the signs or symptoms? Common symptoms of this condition include:  A feeling that your heart is beating very fast.  Chest pain or discomfort.  Feeling short of breath.  Suddenly feeling light-headed or weak.  Getting tired easily during activity.  Fainting.  Sweating. In some cases, there are no symptoms. How is this treated? Treatment for this condition depends on underlying conditions and how you feel when you have atrial fibrillation. They include:  Medicines to: ? Prevent blood clots. ? Treat heart rate or heart rhythm problems.  Using devices, such as a pacemaker, to correct heart rhythm problems.  Doing surgery to remove the part of the heart that sends bad signals.  Closing an area where clots can form in the heart (left atrial appendage). In some cases, your doctor will treat other underlying conditions. Follow these instructions at home: Medicines  Take over-the-counter and prescription medicines only as told by your doctor.  Do not take any new medicines without first talking to your doctor.  If you are taking blood thinners: ? Talk with your doctor before you take any medicines that have aspirin or NSAIDs, such as ibuprofen, in them. ? Take your medicine exactly as told by your doctor. Take it at the same time each day. ? Avoid activities that could hurt or bruise you. Follow instructions about how to prevent falls. ? Wear a bracelet that says you are taking blood thinners. Or, carry a card that lists what medicines you take. Lifestyle      Do not use any products that have nicotine or tobacco in them. These include cigarettes, e-cigarettes, and chewing tobacco. If you need help quitting, ask your doctor.  Eat heart-healthy foods. Talk with your doctor about the right eating plan for you.  Exercise regularly as told by your doctor.  Do not drink alcohol.  Lose weight if you are overweight.  Do not use drugs, including  cannabis. General instructions  If you have a condition that causes breathing to stop for a short period of time (apnea), treat it as told by your doctor.  Keep a healthy weight. Do not use diet pills unless your doctor says they are safe for you. Diet pills may make heart problems worse.  Keep all follow-up visits as told by your doctor. This is important. Contact a doctor if:  You notice a change in the speed, rhythm, or strength of your heartbeat.  You are taking a blood-thinning medicine and you get more bruising.  You get tired more easily when you move or exercise.  You have a  sudden change in weight. Get help right away if:   You have pain in your chest or your belly (abdomen).  You have trouble breathing.  You have side effects of blood thinners, such as blood in your vomit, poop (stool), or pee (urine), or bleeding that cannot stop.  You have any signs of a stroke. "BE FAST" is an easy way to remember the main warning signs: ? B - Balance. Signs are dizziness, sudden trouble walking, or loss of balance. ? E - Eyes. Signs are trouble seeing or a change in how you see. ? F - Face. Signs are sudden weakness or loss of feeling in the face, or the face or eyelid drooping on one side. ? A - Arms. Signs are weakness or loss of feeling in an arm. This happens suddenly and usually on one side of the body. ? S - Speech. Signs are sudden trouble speaking, slurred speech, or trouble understanding what people say. ? T - Time. Time to call emergency services. Write down what time symptoms started.  You have other signs of a stroke, such as: ? A sudden, very bad headache with no known cause. ? Feeling like you may vomit (nausea). ? Vomiting. ? A seizure. These symptoms may be an emergency. Do not wait to see if the symptoms will go away. Get medical help right away. Call your local emergency services (911 in the [place] Do not drive yourself to the hospital. Summary  Atrial  fibrillation is a type of heartbeat that is irregular or fast.  You are at higher risk of this condition if you smoke, are older, have diabetes, or are overweight.  Follow your doctor's instructions about medicines, diet, exercise, and follow-up visits.  Get help right away if you have signs or symptoms of a stroke.  Get help right away if you cannot catch your breath, or you have chest pain or discomfort. This information is not intended to replace advice given to you by your health care provider. Make sure you discuss any questions you have with your health care provider. Document Revised: 12/22/2018 Document Reviewed: 12/22/2018 Elsevier Patient Education  [place].

## 2020-06-25 NOTE — Progress Notes (Signed)
Acute Office Visit  Subjective:    Patient ID: Nathan Lang, male    DOB: 28-May-1971, 49 y.o.   MRN: [id number]  Chief Complaint  Patient presents with  . Irregular Heart Beat         HPI Abdulkarim Eberlin is a 49 year old male presenting today after recommendation from [facility] for prostate cancer after visit this morning.  Discussed with RN at treatment center who reports that during the patients treatment they noted color and mood changes present. When he was attached to cardiac monitoring, they noted absence of P waves and an irregular rhythm. She tells me that in November he had a hemoglobin 5.2 and exhibited the same symptoms and the physician caring for him was concerned he may be experiencing this again. They are unable to take labs or perform 12-lead EKG in the center and recommended he be seen at his PCP's office for further evaluation.   In the office today he endorses weakness, fatigue, shortness of breath, palpitations, and generalized feeling of unwell. He is tearful.    He denies dizziness, lightheadedness, chest pain, abdominal pain, jaw pain, or feelings of impending doom at this time.  He tells me that he regularly receives iron infusions since his low hemoglobin last month. He also tells me that he had to receive blood transfusions when his hemoglobin dropped last time.    Patient also reports increased straining with bowel movements and the presence of external hemorrhoids. He has been using preparation H wipes, but he reports these are not helping with the discomfort and pain he is experiencing. He wants to know if there is anything prescribed medication that may help with the burning and discomfort. He denies rectal bleeding or abdominal pain.   Past Medical History:  Diagnosis Date  . Asthma 12/02/2013  . BPH (benign prostatic hyperplasia) 03/06/2017  . ED (erectile dysfunction) 03/06/2017  . GERD (gastroesophageal reflux disease) 05/05/2017   . Hypertension   . Hypertension 05/05/2017  . Prostate cancer ([place]) 09/14/2018    Past Surgical History:  Procedure Laterality Date  . HERNIA REPAIR    . lung mass exc Left    no records available at this time, clips noted on CXR, pt reports benign mass was removed    Family History  Problem Relation Age of Onset  . Hypertension Mother   . Hypertension Brother     Social History   Socioeconomic History  . Marital status: Married    Spouse name: Not on file  . Number of children: Not on file  . Years of education: Not on file  . Highest education level: Not on file  Occupational History  . Not on file  Tobacco Use  . Smoking status: Current Some Day Smoker    Types: Cigarettes  . Smokeless tobacco: Never Used  Vaping Use  . Vaping Use: Never used  Substance and Sexual Activity  . Alcohol use: Yes  . Drug use: No  . Sexual activity: Yes    Partners: Female    Birth control/protection: None  Other Topics Concern  . Not on file  Social History Narrative  . Not on file   Social Determinants of Health   Financial Resource Strain: Not on file  Food Insecurity: Not on file  Transportation Needs: Not on file  Physical Activity: Not on file  Stress: Not on file  Social Connections: Not on file  Intimate Partner Violence: Not on file  Outpatient Medications Prior to Visit  Medication Sig Dispense Refill  . albuterol (VENTOLIN HFA) 108 (90 Base) MCG/ACT inhaler Inhale 1-2 puffs into the lungs every 4 (four) hours as needed for wheezing (FOR EMERGENCY USE). 18 g 99  . amLODipine (NORVASC) 2.5 MG tablet Take 1 tablet (2.5 mg total) by mouth daily. 180 tablet 3  . benazepril (LOTENSIN) 40 MG tablet Take 1 tablet (40 mg total) by mouth daily. 90 tablet 3  . dexlansoprazole (DEXILANT) 60 MG capsule Take 1 capsule (60 mg total) by mouth daily. 90 capsule 3  . Distilled Water LIQD Use as directed for cancer-related treatments 3780 mL 11  . escitalopram (LEXAPRO) 10 MG  tablet TAKE 1 TABLET(10 MG) BY MOUTH DAILY 90 tablet 3  . famotidine (PEPCID) 40 MG tablet Take 1 tablet (40 mg total) by mouth daily. 90 tablet 3  . hydrochlorothiazide (HYDRODIURIL) 25 MG tablet Take 1 tablet (25 mg total) by mouth daily. 90 tablet 3  . metoprolol succinate (TOPROL-XL) 25 MG 24 hr tablet Take 1 tablet (25 mg total) by mouth daily. 90 tablet 3  . oxybutynin (DITROPAN) 5 MG tablet Take 1 tablet (5 mg total) by mouth 3 (three) times daily. 270 tablet 3  . tamsulosin (FLOMAX) 0.4 MG CAPS capsule Take 1 capsule (0.4 mg total) by mouth daily. 90 capsule 3   No facility-administered medications prior to visit.    No Known Allergies  Review of Systems All review of systems negative except what is listed in the HPI     Objective:    Physical Exam Vitals and nursing note reviewed.  Constitutional:      Appearance: He is ill-appearing.  HENT:     Head: Normocephalic and atraumatic.  Eyes:     Extraocular Movements: Extraocular movements intact.     Conjunctiva/sclera: Conjunctivae normal.     Pupils: Pupils are equal, round, and reactive to light.  Neck:     Vascular: No carotid bruit.  Cardiovascular:     Rate and Rhythm: Normal rate. Rhythm irregular.     Pulses: Normal pulses.     Heart sounds: No murmur heard. No gallop.   Pulmonary:     Effort: Tachypnea present.     Breath sounds: Normal breath sounds.  Abdominal:     General: Abdomen is flat. Bowel sounds are normal. There is no distension.     Palpations: Abdomen is soft.     Tenderness: There is no abdominal tenderness. There is no guarding.  Musculoskeletal:        General: Normal range of motion.     Cervical back: Normal range of motion.     Right lower leg: No edema.     Left lower leg: No edema.  Skin:    General: Skin is warm and dry.     Capillary Refill: Capillary refill takes less than 2 seconds.     Coloration: Skin is pale.  Neurological:     General: No focal deficit present.      Mental Status: He is alert and oriented to person, place, and time.  Psychiatric:        Mood and Affect: Mood normal.        Behavior: Behavior normal.        Thought Content: Thought content normal.        Judgment: Judgment normal.     Ht 5\' 7"  (1.702 m)   BMI 30.40 kg/m  Wt Readings from Last 3 Encounters:  05/09/20 194  lb 1.9 oz (88.1 kg)  09/30/19 190 lb (86.2 kg)  09/15/19 194 lb (88 kg)    Health Maintenance Due  Topic Date Due  . COVID-19 Vaccine (3 - Pfizer risk 4-dose series) 11/14/2019    There are no preventive care reminders to display for this patient.   Lab Results  Component Value Date   TSH 0.99 12/31/2017   Lab Results  Component Value Date   WBC 3.9 05/09/2020   HGB 7.2 (L) 05/09/2020   HCT 21.8 (L) 05/09/2020   MCV 90.1 05/09/2020   PLT 182 05/09/2020   Lab Results  Component Value Date   NA 140 05/09/2020   K 4.0 05/09/2020   CO2 26 05/09/2020   GLUCOSE 97 05/09/2020   BUN 16 05/09/2020   CREATININE 1.07 05/09/2020   BILITOT 0.5 05/09/2020   AST 93 (H) 05/09/2020   ALT 88 (H) 05/09/2020   PROT 5.6 (L) 05/09/2020   CALCIUM 8.9 05/09/2020   Lab Results  Component Value Date   CHOL 158 05/09/2020   Lab Results  Component Value Date   HDL 35 (L) 05/09/2020   Lab Results  Component Value Date   LDLCALC 99 05/09/2020   Lab Results  Component Value Date   TRIG 142 05/09/2020   Lab Results  Component Value Date   CHOLHDL 4.5 05/09/2020   Lab Results  Component Value Date   HGBA1C 4.9 05/09/2020       Assessment & Plan:   1. Irregular heart beat 2. History of anemia 3. Prostate cancer [facility]) Cardiac irregularities reportedly noted on monitoring in Hyperbaric treatment center this morning.  EKG obtained in office today reveals atrial fibrillation, with possible atrial flutter intermittently noted most prevalent in leads II and V1.  At this time his vitals are stable, but he is clearly dyspneic and ill in appearance.   With his current symptoms, history of severe anemia, history of cancer, and new onset of cardiac arrhythmia I feel that it is in his best interest to go to the emergency room for further evaluation.  Discussed the EKG findings and recommendations with patient and he agreed to report to the [facility] Emergency Room for further evaluation. He does not with for EMS transport today.  Attempt made to contact [facility] ED via telephone to let them know the patient would be coming, but multiple calls went unanswered.  - EKG 12-Lead  4. Hemorrhoids, unspecified hemorrhoid type Symptoms consistent with external hemorrhoids. Prescription for anusol suppositories provided for BID use as needed for symptoms. Discussed with patient the importance of adequate water intake and the use of daily stool softener to help prevent hard stools from forming. Recommend non-stimulant stool softener to start with.  If symptoms worsen or fail to improve, please let us know.  - hydrocortisone (ANUSOL-HC) 25 MG suppository; Place 1 suppository (25 mg total) rectally 2 (two) times daily as needed for hemorrhoids.  Dispense: 24 suppository; Refill: 2     Orma Render, NP

## 2020-06-26 ENCOUNTER — Other Ambulatory Visit: Payer: Self-pay | Admitting: Nurse Practitioner

## 2020-06-27 ENCOUNTER — Ambulatory Visit (INDEPENDENT_AMBULATORY_CARE_PROVIDER_SITE_OTHER): Payer: Managed Care, Other (non HMO) | Admitting: Cardiology

## 2020-06-27 ENCOUNTER — Encounter: Payer: Self-pay | Admitting: Osteopathic Medicine

## 2020-06-27 ENCOUNTER — Other Ambulatory Visit: Payer: Self-pay

## 2020-06-27 ENCOUNTER — Encounter: Payer: Self-pay | Admitting: Cardiology

## 2020-06-27 ENCOUNTER — Ambulatory Visit (INDEPENDENT_AMBULATORY_CARE_PROVIDER_SITE_OTHER): Payer: Managed Care, Other (non HMO) | Admitting: Osteopathic Medicine

## 2020-06-27 VITALS — BP 131/57 | HR 72 | Ht 67.0 in | Wt 196.0 lb

## 2020-06-27 DIAGNOSIS — K649 Unspecified hemorrhoids: Secondary | ICD-10-CM | POA: Diagnosis not present

## 2020-06-27 DIAGNOSIS — F411 Generalized anxiety disorder: Secondary | ICD-10-CM

## 2020-06-27 DIAGNOSIS — I10 Essential (primary) hypertension: Secondary | ICD-10-CM

## 2020-06-27 DIAGNOSIS — R319 Hematuria, unspecified: Secondary | ICD-10-CM

## 2020-06-27 DIAGNOSIS — I480 Paroxysmal atrial fibrillation: Secondary | ICD-10-CM

## 2020-06-27 DIAGNOSIS — K219 Gastro-esophageal reflux disease without esophagitis: Secondary | ICD-10-CM | POA: Diagnosis not present

## 2020-06-27 DIAGNOSIS — R52 Pain, unspecified: Secondary | ICD-10-CM

## 2020-06-27 MED ORDER — METOPROLOL SUCCINATE ER 25 MG PO TB24: 25.0000 mg | ORAL_TABLET | Freq: Every day | ORAL | 3 refills | Status: DC

## 2020-06-27 MED ORDER — OXYBUTYNIN CHLORIDE 5 MG PO TABS: 5.0000 mg | ORAL_TABLET | Freq: Three times a day (TID) | ORAL | 3 refills | Status: DC

## 2020-06-27 MED ORDER — HYDROCHLOROTHIAZIDE 25 MG PO TABS: 25.0000 mg | ORAL_TABLET | Freq: Every day | ORAL | 3 refills | Status: DC

## 2020-06-27 MED ORDER — PANTOPRAZOLE SODIUM 40 MG PO TBEC: 40.0000 mg | DELAYED_RELEASE_TABLET | Freq: Every day | ORAL | 3 refills | Status: DC

## 2020-06-27 MED ORDER — TAMSULOSIN HCL 0.4 MG PO CAPS: 0.4000 mg | ORAL_CAPSULE | Freq: Every day | ORAL | 3 refills | Status: DC

## 2020-06-27 MED ORDER — BENAZEPRIL HCL 40 MG PO TABS: 40.0000 mg | ORAL_TABLET | Freq: Every day | ORAL | 3 refills | Status: DC

## 2020-06-27 MED ORDER — HYDROCORTISONE ACETATE 25 MG RE SUPP: 25.0000 mg | Freq: Two times a day (BID) | RECTAL | 2 refills | Status: DC | PRN

## 2020-06-27 MED ORDER — TADALAFIL 5 MG PO TABS: 5.0000 mg | ORAL_TABLET | Freq: Every day | ORAL | 3 refills | Status: DC

## 2020-06-27 MED ORDER — POLYSACCHARIDE IRON COMPLEX 150 MG PO CAPS: 150.0000 mg | ORAL_CAPSULE | Freq: Every day | ORAL | 3 refills | Status: DC

## 2020-06-27 MED ORDER — FAMOTIDINE 40 MG PO TABS: 40.0000 mg | ORAL_TABLET | Freq: Every day | ORAL | 3 refills | Status: DC

## 2020-06-27 MED ORDER — ESCITALOPRAM OXALATE 10 MG PO TABS: 10.0000 mg | ORAL_TABLET | Freq: Every day | ORAL | 3 refills | Status: DC

## 2020-06-27 NOTE — Progress Notes (Signed)
Nathan Lang is a 49 y.o. male who presents to  [place] at [facility]  today, 06/27/20, seeking care for the following:  . New onset A-fib / ER follow-up: was sent to ER 2 days ago given dyspnea w/ new onset Afib, and need for urgent labs given hx anemia. 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 Rate was controlled in ED so was decided no anticoagulation/additional rate control needed. Referral made to cardiologist outpatient, looks like appt is scheduled 08/02/20. CHADS2VASC 1, EKG today NSR w/ nonspecific T-wave abnormality. RRR on exam, normal S1S2, no LE edema    BP Readings from Last 3 Encounters:  06/27/20 (!) 131/57  06/25/20 (!) 118/58  05/09/20 138/82   Wt Readings from Last 3 Encounters:  06/27/20 196 lb (88.9 kg)  06/25/20 195 lb 14.4 oz (88.9 kg)  05/09/20 194 lb 1.9 oz (88.1 kg)     ASSESSMENT & PLAN with other pertinent findings:  The primary encounter diagnosis was Paroxysmal atrial fibrillation ([place]). Diagnoses of Hemorrhoids, unspecified hemorrhoid type, Essential hypertension, Gastroesophageal reflux disease without esophagitis, Generalized anxiety disorder, Pain, and Hematuria, unspecified type were also pertinent to this visit.   Referral to cardiology - patient is very anxious about his condition, very much appreciate Dr Stanford Breed working him in today!   Reviewed med list w/ patient, he is not clear on his current medications, advised him compare his AVS list (pending changes made by cardiology) w/ his Rx's at home      Follow-up instructions: Return in about 2 weeks (around 07/11/2020) for blood pressure follow-up here .                                         There were no vitals taken for this visit.  No outpatient medications have been marked as taking for the 06/27/20  encounter (Appointment) with Emeterio Reeve, DO.    No results found for this or any previous visit (from the past 72 hour(s)).  No results found.     All questions at time of visit were answered - patient instructed to contact office with any additional concerns or updates.  ER/RTC precautions were reviewed with the patient as applicable.   Please note: voice recognition software was used to produce this document, and typos may escape review. Please contact Dr. Sheppard Coil for any needed clarifications.

## 2020-06-27 NOTE — Progress Notes (Signed)
Referring-Natalie Alexander, DO Reason for referral-atrial fibrillation  HPI: 49 year old male for evaluation of atrial fibrillation at request of Emeterio Reeve, DO.  CTA March 2020 showed calcium score of 10.6 which was 78th percentile, ejection fraction 76% and no evidence of coronary artery disease.  Laboratories October 2021 showed creatinine 1.07, AST 93, ALT 88, hemoglobin 7.2.  Note patient has a history of known hematuria felt secondary to prostate cancer radiation treatments.  Patient seen in the emergency room at [facility] June 25, 2020 with complaints of increased dyspnea on exertion.  He was found to be in atrial fibrillation.  Seen in follow-up today by primary care and cardiology asked to evaluate.  Laboratories December 2021 showed ferritin 269, hemoglobin 7.4, TSH 1.18.  Patient states that he has had some fatigue and dyspnea on exertion for several days.  He typically does not have dyspnea on exertion, orthopnea, PND, pedal edema, chest pain, palpitations or syncope.  Cardiology now asked to evaluate.  Current Outpatient Medications  Medication Sig Dispense Refill   albuterol (VENTOLIN HFA) 108 (90 Base) MCG/ACT inhaler Inhale 1-2 puffs into the lungs every 4 (four) hours as needed for wheezing (FOR EMERGENCY USE). 18 g 99   benazepril (LOTENSIN) 40 MG tablet Take 1 tablet (40 mg total) by mouth daily. For high blood pressure 90 tablet 3   Distilled Water LIQD Use as directed for cancer-related treatments 3780 mL 11   escitalopram (LEXAPRO) 10 MG tablet Take 1 tablet (10 mg total) by mouth daily. For anxiety / nerves 90 tablet 3   famotidine (PEPCID) 40 MG tablet Take 1 tablet (40 mg total) by mouth daily. To prevent stomach ulcers / treat acid reflux 90 tablet 3   hydrochlorothiazide (HYDRODIURIL) 25 MG tablet Take 1 tablet (25 mg total) by mouth daily. For high blood pressure 90 tablet 3   hydrocortisone (ANUSOL-HC) 25 MG suppository Place 1 suppository (25 mg total)  rectally 2 (two) times daily as needed for hemorrhoids. 24 suppository 2   iron polysaccharides (NIFEREX) 150 MG capsule Take 1 capsule (150 mg total) by mouth daily. For iron deficiency anemia 90 capsule 3   metoprolol succinate (TOPROL-XL) 25 MG 24 hr tablet Take 1 tablet (25 mg total) by mouth daily. For blood pressure and to keep heart rate under control 90 tablet 3   oxybutynin (DITROPAN) 5 MG tablet Take 1 tablet (5 mg total) by mouth 3 (three) times daily. To help with bladder spasm 270 tablet 3   pantoprazole (PROTONIX) 40 MG tablet Take 1 tablet (40 mg total) by mouth daily. To prevent stomach ulcers / treat acid reflux 90 tablet 3   sulfamethoxazole-trimethoprim (BACTRIM DS) 800-160 MG tablet Take 1 tablet by mouth 2 (two) times daily.     tadalafil (CIALIS) 5 MG tablet Take 1 tablet (5 mg total) by mouth daily. For erectile dysfunction 90 tablet 3   tamsulosin (FLOMAX) 0.4 MG CAPS capsule Take 1 capsule (0.4 mg total) by mouth daily. To help urine flow 90 capsule 3   No current facility-administered medications for this visit.    No Known Allergies   Past Medical History:  Diagnosis Date   Asthma 12/02/2013   BPH (benign prostatic hyperplasia) 03/06/2017   ED (erectile dysfunction) 03/06/2017   GERD (gastroesophageal reflux disease) 05/05/2017   Hypertension 05/05/2017   Prostate cancer ([place]) 09/14/2018    Past Surgical History:  Procedure Laterality Date   HERNIA REPAIR     lung mass exc Left  no records available at this time, clips noted on CXR, pt reports benign mass was removed   PROSTATECTOMY      Social History   Socioeconomic History   Marital status: Married    Spouse name: Not on file   Number of children: 3   Years of education: Not on file   Highest education level: Not on file  Occupational History   Not on file  Tobacco Use   Smoking status: Former Smoker    Types: Cigarettes   Smokeless tobacco: Never Used  Brewing technologist Use: Never used  Substance and Sexual Activity   Alcohol use: Yes    Comment: Occasional   Drug use: No   Sexual activity: Yes    Partners: Female    Birth control/protection: None  Other Topics Concern   Not on file  Social History Narrative   Not on file   Social Determinants of Health   Financial Resource Strain: Not on file  Food Insecurity: Not on file  Transportation Needs: Not on file  Physical Activity: Not on file  Stress: Not on file  Social Connections: Not on file  Intimate Partner Violence: Not on file    Family History  Problem Relation Age of Onset   Hypertension Mother    Hypertension Brother     ROS: Hematuria but no fevers or chills, productive cough, hemoptysis, dysphasia, odynophagia, melena, hematochezia, dysuria, rash, seizure activity, orthopnea, PND, pedal edema, claudication. Remaining systems are negative.  Physical Exam:   Blood pressure (!) 131/57, pulse 72, height 5\' 7"  (1.702 m), weight 196 lb (88.9 kg).  General:  Well developed/well nourished in NAD Skin warm/dry Patient not depressed No peripheral clubbing Back-normal HEENT-normal/normal eyelids Neck supple/normal carotid upstroke bilaterally; no bruits; no JVD; no thyromegaly chest - CTA/ normal expansion CV - RRR/normal S1 and S2; no murmurs, rubs or gallops;  PMI nondisplaced Abdomen -NT/ND, no HSM, no mass, + bowel sounds, no bruit 2+ femoral pulses, no bruits Ext-no edema, chords, 2+ DP Neuro-grossly nonfocal  ECG -sinus rhythm at a rate of 63, first-degree AV block, left ventricular hypertrophy with repolarization abnormality.  Personally reviewed  A/P  1 newly diagnosed persistent atrial fibrillation-patient by report had atrial fibrillation on outside electrocardiogram.  I will request those to review personally.  Today he is in sinus rhythm.  We will continue Toprol for rate control if atrial fibrillation recurs.  Recent TSH normal.  Schedule echocardiogram  to assess LV function.  Embolic risk factor includes hypertension.  However he is not a candidate for anticoagulation given significant hematuria/anemia.  We will consider in the future if this improves.  2 hypertension-patient's blood pressure is controlled.  Continue present medications and follow.  3 prostate cancer/hematuria-Per urology.  Kirk Ruths, MD

## 2020-06-27 NOTE — Patient Instructions (Signed)
°  Testing/Procedures:  Your physician has requested that you have an echocardiogram. Echocardiography is a painless test that uses sound waves to create images of your heart. It provides your doctor with information about the size and shape of your heart and how well your hearts chambers and valves are working. This procedure takes approximately one hour. There are no restrictions for this procedure.[facility] [place] -[facility] FLOOR IMAGING     Follow-Up: At [facility], you and your health needs are our priority.  As part of our continuing mission to provide you with exceptional heart care, we have created designated Provider Care Teams.  These Care Teams include your primary Cardiologist (physician) and Advanced Practice Providers (APPs -  Physician Assistants and Nurse Practitioners) who all work together to provide you with the care you need, when you need it.  We recommend signing up for the patient portal called "MyChart".  Sign up information is provided on this After Visit Summary.  MyChart is used to connect with patients for Virtual Visits (Telemedicine).  Patients are able to view lab/test results, encounter notes, upcoming appointments, etc.  Non-urgent messages can be sent to your provider as well.   To learn more about what you can do with MyChart, go to [email or web address].    Your next appointment:   3 month(s)  The format for your next appointment:   In Person  Provider:   Kirk Ruths, MD

## 2020-07-19 DIAGNOSIS — Z8546 Personal history of malignant neoplasm of prostate: Secondary | ICD-10-CM | POA: Insufficient documentation

## 2020-07-19 DIAGNOSIS — R310 Gross hematuria: Secondary | ICD-10-CM | POA: Insufficient documentation

## 2020-07-27 ENCOUNTER — Other Ambulatory Visit: Payer: Self-pay

## 2020-07-27 ENCOUNTER — Ambulatory Visit (HOSPITAL_BASED_OUTPATIENT_CLINIC_OR_DEPARTMENT_OTHER)
Admission: RE | Admit: 2020-07-27 | Discharge: 2020-07-27 | Disposition: A | Discharge status: Home / Self Care | Payer: 59 | Source: Ambulatory Visit | Attending: Cardiology | Admitting: Cardiology

## 2020-07-27 DIAGNOSIS — I480 Paroxysmal atrial fibrillation: Secondary | ICD-10-CM | POA: Insufficient documentation

## 2020-07-28 LAB — ECHOCARDIOGRAM COMPLETE
Area-P 1/2: 3.3 cm2
Calc EF: 56.6 %
S' Lateral: 4.39 cm
Single Plane A2C EF: 54.8 %
Single Plane A4C EF: 58.5 %

## 2020-08-02 ENCOUNTER — Telehealth: Payer: Self-pay | Admitting: *Deleted

## 2020-08-02 NOTE — Telephone Encounter (Signed)
Pt called wanting his labs released.  The only thing I can see is UA, urine culture, and a cbc that's pended from 06/05/20 (which I cannot release).  Do you want [place] to reorder these or have him make appt with you?  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  Please advise.

## 2020-08-02 NOTE — Telephone Encounter (Signed)
Not sure what he's asking, last visit w/ me he was supposed to come back for nurse visit BP recheck and he just got lats at [facility] a couple weeks ago - no labs at this time without visit to discuss reason (unless he's asking about annual physical labs? CBC, CMP, Lipids ok to order if so)

## 2020-08-10 ENCOUNTER — Encounter: Payer: Self-pay | Admitting: Nurse Practitioner

## 2020-08-10 ENCOUNTER — Other Ambulatory Visit: Payer: Self-pay

## 2020-08-10 ENCOUNTER — Ambulatory Visit (INDEPENDENT_AMBULATORY_CARE_PROVIDER_SITE_OTHER): Payer: 59 | Admitting: Nurse Practitioner

## 2020-08-10 VITALS — BP 139/90 | HR 53 | Temp 97.5°F | Wt 186.0 lb

## 2020-08-10 DIAGNOSIS — N3090 Cystitis, unspecified without hematuria: Secondary | ICD-10-CM | POA: Diagnosis not present

## 2020-08-10 DIAGNOSIS — R52 Pain, unspecified: Secondary | ICD-10-CM | POA: Diagnosis not present

## 2020-08-10 LAB — POCT URINALYSIS DIP (CLINITEK)
Glucose, UA: NEGATIVE mg/dL
Ketones, POC UA: NEGATIVE mg/dL
Nitrite, UA: NEGATIVE
POC PROTEIN,UA: >=300 — AB
Spec Grav, UA: 1.015 (ref 1.010–1.025)
Urobilinogen, UA: 0.2 E.U./dL
pH, UA: 7 (ref 5.0–8.0)

## 2020-08-10 MED ORDER — CIPROFLOXACIN HCL 500 MG PO TABS: 500.0000 mg | ORAL_TABLET | Freq: Two times a day (BID) | ORAL | 0 refills | Status: DC

## 2020-08-10 MED ORDER — PHENAZOPYRIDINE HCL 200 MG PO TABS: 200.0000 mg | ORAL_TABLET | Freq: Once | ORAL | 0 refills | Status: AC

## 2020-08-10 MED ORDER — PHENAZOPYRIDINE HCL 200 MG PO TABS
200.0000 mg | ORAL_TABLET | Freq: Once | ORAL | 0 refills | Status: DC
Start: 2020-08-10 — End: 2020-08-10

## 2020-08-10 MED ORDER — KETOROLAC TROMETHAMINE 60 MG/2ML IM SOLN
60.0000 mg | Freq: Once | INTRAMUSCULAR | Status: AC
Start: 2020-08-10 — End: 2020-08-10
  Administered 2020-08-10: 60 mg via INTRAMUSCULAR

## 2020-08-10 MED ORDER — KETOROLAC TROMETHAMINE 60 MG/2ML IM SOLN: 60.0000 mg | Freq: Once | INTRAMUSCULAR | Status: DC

## 2020-08-10 NOTE — Patient Instructions (Signed)
Urinary Tract Infection, Adult A urinary tract infection (UTI) is an infection of any part of the urinary tract. The urinary tract includes:  The kidneys.  The ureters.  The bladder.  The urethra. These organs make, store, and get rid of pee (urine) in the body. What are the causes? This infection is caused by germs (bacteria) in your genital area. These germs grow and cause swelling (inflammation) of your urinary tract. What increases the risk? The following factors may make you more likely to develop this condition:  Using a small, thin tube (catheter) to drain pee.  Not being able to control when you pee or poop (incontinence).  Being male. If you are male, these things can increase the risk: ? Using these methods to prevent pregnancy:  A medicine that kills sperm (spermicide).  A device that blocks sperm (diaphragm). ? Having low levels of a male hormone (estrogen). ? Being pregnant. You are more likely to develop this condition if:  You have genes that add to your risk.  You are sexually active.  You take antibiotic medicines.  You have trouble peeing because of: ? A prostate that is bigger than normal, if you are male. ? A blockage in the part of your body that drains pee from the bladder. ? A kidney stone. ? A nerve condition that affects your bladder. ? Not getting enough to drink. ? Not peeing often enough.  You have other conditions, such as: ? Diabetes. ? A weak disease-fighting system (immune system). ? Sickle cell disease. ? Gout. ? Injury of the spine. What are the signs or symptoms? Symptoms of this condition include:  Needing to pee right away.  Peeing small amounts often.  Pain or burning when peeing.  Blood in the pee.  Pee that smells bad or not like normal.  Trouble peeing.  Pee that is cloudy.  Fluid coming from the vagina, if you are male.  Pain in the belly or lower back. Other symptoms include:  Vomiting.  Not  feeling hungry.  Feeling mixed up (confused). This may be the first symptom in older adults.  Being tired and grouchy (irritable).  A fever.  Watery poop (diarrhea). How is this treated?  Taking antibiotic medicine.  Taking other medicines.  Drinking enough water. In some cases, you may need to see a specialist. Follow these instructions at home: Medicines  Take over-the-counter and prescription medicines only as told by your doctor.  If you were prescribed an antibiotic medicine, take it as told by your doctor. Do not stop taking it even if you start to feel better. General instructions  Make sure you: ? Pee until your bladder is empty. ? Do not hold pee for a long time. ? Empty your bladder after sex. ? Wipe from front to back after peeing or pooping if you are a male. Use each tissue one time when you wipe.  Drink enough fluid to keep your pee pale yellow.  Keep all follow-up visits.   Contact a doctor if:  You do not get better after 1-2 days.  Your symptoms go away and then come back. Get help right away if:  You have very bad back pain.  You have very bad pain in your lower belly.  You have a fever.  You have chills.  You feeling like you will vomit or you vomit. Summary  A urinary tract infection (UTI) is an infection of any part of the urinary tract.  This condition is caused by   germs in your genital area.  There are many risk factors for a UTI.  Treatment includes antibiotic medicines.  Drink enough fluid to keep your pee pale yellow. This information is not intended to replace advice given to you by your health care provider. Make sure you discuss any questions you have with your health care provider. Document Revised: 02/10/2020 Document Reviewed: 02/10/2020 Elsevier Patient Education  2021 Elsevier Inc.  

## 2020-08-10 NOTE — Progress Notes (Signed)
Acute Office Visit  Subjective:    Patient ID: Nathan Lang, male    DOB: 09-Jan-1971, 50 y.o.   MRN: [id number]  Chief Complaint  Patient presents with  . Urinary Tract Infection    HPI Patient is in today for symptoms of suprapubic pain, dysuria, increased voiding, small volume voids, hematuria, and back pain that have been progressively worsening. He has a history of prostate cancer and has had surgical removal and radiation treatment which has left him with a few urinary issues. He had a cystoscopy about 3 weeks ago and was having blood clots in his urine after that then the urinary symptoms started. He was seen in the ED for the urinary symptoms and hematuria on the 08/03/2020 and was given a prescription for keflex. He reports that he completed all of the antibiotic, but this has not helped his symptoms at all. He is still in severe pain in his lower abdomen and back. He reports that he is able to urinate at this time.   He denies fevers, chills, nausea, vomiting, or diarrhea.   Past Medical History:  Diagnosis Date  . Asthma 12/02/2013  . BPH (benign prostatic hyperplasia) 03/06/2017  . ED (erectile dysfunction) 03/06/2017  . GERD (gastroesophageal reflux disease) 05/05/2017  . Hypertension 05/05/2017  . Prostate cancer ([place]) 09/14/2018    Past Surgical History:  Procedure Laterality Date  . HERNIA REPAIR    . lung mass exc Left    no records available at this time, clips noted on CXR, pt reports benign mass was removed  . PROSTATECTOMY      Family History  Problem Relation Age of Onset  . Hypertension Mother   . Hypertension Brother     Social History   Socioeconomic History  . Marital status: Married    Spouse name: Not on file  . Number of children: 3  . Years of education: Not on file  . Highest education level: Not on file  Occupational History  . Not on file  Tobacco Use  . Smoking status: Former Smoker    Types: Cigarettes  . Smokeless tobacco: Never Used   Vaping Use  . Vaping Use: Never used  Substance and Sexual Activity  . Alcohol use: Yes    Comment: Occasional  . Drug use: No  . Sexual activity: Yes    Partners: Female    Birth control/protection: None  Other Topics Concern  . Not on file  Social History Narrative  . Not on file   Social Determinants of Health   Financial Resource Strain: Not on file  Food Insecurity: Not on file  Transportation Needs: Not on file  Physical Activity: Not on file  Stress: Not on file  Social Connections: Not on file  Intimate Partner Violence: Not on file    Outpatient Medications Prior to Visit  Medication Sig Dispense Refill  . albuterol (VENTOLIN HFA) 108 (90 Base) MCG/ACT inhaler Inhale 1-2 puffs into the lungs every 4 (four) hours as needed for wheezing (FOR EMERGENCY USE). 18 g 99  . benazepril (LOTENSIN) 40 MG tablet Take 1 tablet (40 mg total) by mouth daily. For high blood pressure 90 tablet 3  . Distilled Water LIQD Use as directed for cancer-related treatments 3780 mL 11  . escitalopram (LEXAPRO) 10 MG tablet Take 1 tablet (10 mg total) by mouth daily. For anxiety / nerves 90 tablet 3  . famotidine (PEPCID) 40 MG tablet Take 1 tablet (40 mg total) by mouth daily. To  prevent stomach ulcers / treat acid reflux 90 tablet 3  . hydrochlorothiazide (HYDRODIURIL) 25 MG tablet Take 1 tablet (25 mg total) by mouth daily. For high blood pressure 90 tablet 3  . hydrocortisone (ANUSOL-HC) 25 MG suppository Place 1 suppository (25 mg total) rectally 2 (two) times daily as needed for hemorrhoids. 24 suppository 2  . iron polysaccharides (NIFEREX) 150 MG capsule Take 1 capsule (150 mg total) by mouth daily. For iron deficiency anemia 90 capsule 3  . metoprolol succinate (TOPROL-XL) 25 MG 24 hr tablet Take 1 tablet (25 mg total) by mouth daily. For blood pressure and to keep heart rate under control 90 tablet 3  . oxybutynin (DITROPAN) 5 MG tablet Take 1 tablet (5 mg total) by mouth 3 (three) times  daily. To help with bladder spasm 270 tablet 3  . pantoprazole (PROTONIX) 40 MG tablet Take 1 tablet (40 mg total) by mouth daily. To prevent stomach ulcers / treat acid reflux 90 tablet 3  . sulfamethoxazole-trimethoprim (BACTRIM DS) 800-160 MG tablet Take 1 tablet by mouth 2 (two) times daily.    . tadalafil (CIALIS) 5 MG tablet Take 1 tablet (5 mg total) by mouth daily. For erectile dysfunction 90 tablet 3  . tamsulosin (FLOMAX) 0.4 MG CAPS capsule Take 1 capsule (0.4 mg total) by mouth daily. To help urine flow 90 capsule 3   No facility-administered medications prior to visit.    No Known Allergies  Review of Systems All review of systems negative except what is listed in the HPI     Objective:    Physical Exam Vitals and nursing note reviewed.  Constitutional:      Appearance: He is ill-appearing.  HENT:     Head: Normocephalic.  Eyes:     Extraocular Movements: Extraocular movements intact.     Conjunctiva/sclera: Conjunctivae normal.     Pupils: Pupils are equal, round, and reactive to light.  Cardiovascular:     Rate and Rhythm: Normal rate and regular rhythm.     Pulses: Normal pulses.     Heart sounds: Normal heart sounds.  Pulmonary:     Effort: Pulmonary effort is normal.     Breath sounds: Normal breath sounds.  Abdominal:     General: Abdomen is flat. Bowel sounds are normal. There is no distension.     Palpations: Abdomen is soft.     Tenderness: There is abdominal tenderness in the suprapubic area. There is no right CVA tenderness, left CVA tenderness or rebound.  Musculoskeletal:        General: Normal range of motion.  Skin:    General: Skin is warm and dry.     Capillary Refill: Capillary refill takes less than 2 seconds.  Neurological:     General: No focal deficit present.     Mental Status: He is alert and oriented to person, place, and time.  Psychiatric:        Mood and Affect: Mood normal.        Behavior: Behavior normal.        Thought  Content: Thought content normal.        Judgment: Judgment normal.     BP 139/90   Pulse (!) 53   Temp (!) 97.5 F (36.4 C)   Wt 186 lb (84.4 kg)   SpO2 99%   BMI 29.13 kg/m  Wt Readings from Last 3 Encounters:  08/10/20 186 lb (84.4 kg)  06/27/20 196 lb (88.9 kg)  06/27/20 196 lb (88.9  kg)    There are no preventive care reminders to display for this patient.  There are no preventive care reminders to display for this patient.   Lab Results  Component Value Date   TSH 0.99 12/31/2017   Lab Results  Component Value Date   WBC 3.9 05/09/2020   HGB 7.2 (L) 05/09/2020   HCT 21.8 (L) 05/09/2020   MCV 90.1 05/09/2020   PLT 182 05/09/2020   Lab Results  Component Value Date   NA 140 05/09/2020   K 4.0 05/09/2020   CO2 26 05/09/2020   GLUCOSE 97 05/09/2020   BUN 16 05/09/2020   CREATININE 1.07 05/09/2020   BILITOT 0.5 05/09/2020   AST 93 (H) 05/09/2020   ALT 88 (H) 05/09/2020   PROT 5.6 (L) 05/09/2020   CALCIUM 8.9 05/09/2020   Lab Results  Component Value Date   CHOL 158 05/09/2020   Lab Results  Component Value Date   HDL 35 (L) 05/09/2020   Lab Results  Component Value Date   LDLCALC 99 05/09/2020   Lab Results  Component Value Date   TRIG 142 05/09/2020   Lab Results  Component Value Date   CHOLHDL 4.5 05/09/2020   Lab Results  Component Value Date   HGBA1C 4.9 05/09/2020       Assessment & Plan:   1. Pain 2. Cystitis - POCT URINALYSIS DIP (CLINITEK) - ketorolac (TORADOL) injection 60 mg - ciprofloxacin (CIPRO) 500 MG tablet; Take 1 tablet (500 mg total) by mouth 2 (two) times daily.  Dispense: 14 tablet; Refill: 0 - phenazopyridine (PYRIDIUM) 200 MG tablet; Take 1 tablet (200 mg total) by mouth once for 1 dose.  Dispense: 1 tablet; Refill: 0  Symptoms and presentation consistent with cystitis. We will obtain culture today. UA shows the presence of blood and neutrophils consistent with UTI. Based on symptoms, plan to start treatment  with ciprofloxacin and pyridium. Given Toradol injection in the office today for pain.  Will make changes to the plan of care as necessary based on results of the culture.  Recommend follow-up if symptoms worsen or if he is unable to void. No signs of blockage today, but this could present given the blood that has been present and recommend monitoring for this.   Return if symptoms worsen or fail to improve.   Orma Render, NP

## 2020-08-16 DIAGNOSIS — B9620 Unspecified Escherichia coli [E. coli] as the cause of diseases classified elsewhere: Secondary | ICD-10-CM | POA: Insufficient documentation

## 2020-08-16 DIAGNOSIS — R7881 Bacteremia: Secondary | ICD-10-CM | POA: Insufficient documentation

## 2020-08-23 ENCOUNTER — Other Ambulatory Visit: Payer: Self-pay

## 2020-08-23 ENCOUNTER — Encounter: Payer: Self-pay | Admitting: Osteopathic Medicine

## 2020-08-23 ENCOUNTER — Ambulatory Visit (INDEPENDENT_AMBULATORY_CARE_PROVIDER_SITE_OTHER): Payer: 59 | Admitting: Osteopathic Medicine

## 2020-08-23 VITALS — BP 149/86 | HR 56 | Temp 97.8°F | Wt 186.1 lb

## 2020-08-23 DIAGNOSIS — N3040 Irradiation cystitis without hematuria: Secondary | ICD-10-CM | POA: Diagnosis not present

## 2020-08-23 DIAGNOSIS — I10 Essential (primary) hypertension: Secondary | ICD-10-CM

## 2020-08-23 DIAGNOSIS — N3090 Cystitis, unspecified without hematuria: Secondary | ICD-10-CM

## 2020-08-23 DIAGNOSIS — B3749 Other urogenital candidiasis: Secondary | ICD-10-CM

## 2020-08-23 DIAGNOSIS — R319 Hematuria, unspecified: Secondary | ICD-10-CM

## 2020-08-23 MED ORDER — AMLODIPINE BESYLATE 10 MG PO TABS: 10.0000 mg | ORAL_TABLET | Freq: Every day | ORAL | 0 refills | Status: DC

## 2020-08-23 MED ORDER — FLUCONAZOLE 150 MG PO TABS: 150.0000 mg | ORAL_TABLET | Freq: Once | ORAL | 1 refills | Status: AC

## 2020-08-23 MED ORDER — FLUCONAZOLE 150 MG PO TABS: 150.0000 mg | ORAL_TABLET | Freq: Once | ORAL | 1 refills | Status: DC

## 2020-08-23 NOTE — Patient Instructions (Addendum)
Plan:  Referral to urology w/ [facility] for second opinion and help w/ managing frequent infections, urinary symptoms, cancer surveillance.   Urine culture from the ER showed yeast, this might be why the Ciprofloxacin antibiotic isn't solving symptoms. I've sent fluconazole aka Diflucan to pharmacy to treat this. Hopefully this works better!   For blood pressure: Hyperbaric treatment may increase blood pressure, hopefully when you're done with that we might be able to come off of the new medicines, for now:  CONTINUE metoprolol, benazepril, hydrochlorothiazide  START amlodipine 10 mg pills, take 1/2 tablet (5 mg) daily for the first 4-6 days then increase to whole tablet (10 mg)

## 2020-08-23 NOTE — Progress Notes (Signed)
HPI: Nathan Lang is a 50 y.o. male who  has a past medical history of Asthma (12/02/2013), BPH (benign prostatic hyperplasia) (03/06/2017), ED (erectile dysfunction) (03/06/2017), GERD (gastroesophageal reflux disease) (05/05/2017), Hypertension (05/05/2017), and Prostate cancer ([place]) (09/14/2018).  he presents to [facility] today, 08/23/20,  for chief complaint of:  HTN  Current meds: benazepril 40 mg daily, HCTZ 25 mg daily, metoprolol succinate 25 mg daily, tadalafil 5 mg  BP Readings from Last 3 Encounters:  08/23/20 (!) 149/86  08/10/20 139/90  06/27/20 (!) 131/57   pt currently receiving daily hyperbaric oxygen therapy for prostate cancer. It has been noted that he has increased blood pressure readings over the past few weeks.   Denies: chest pain, shortness of breath, dizziness, palpitations, headache  Urinary burning x2-3 days  . Context: reviewed discharge summary from recent hospitalization  08/16/20-08/17/20, Dx E Coli bacteremia Tx Ceftin at direction of ID specialist  o Urine culture positive for yeast which resulted probably after he was d/c from hospital  . Assoc signs/symptoms:  o Denies fever, abdominal pain, blood in urine, back pain o Of note: abdominal pain and back pain were initially present but improved after starting antibiotics. After these symptoms improved dysuria began.   Incontinence  Pt reports urin leakage with coughing and sneezing for 2 years since prostate removal. Reports needing to use bed pads due to leakage. Chronic issue with no recent changes.   Past medical, surgical, social and family history reviewed:  Patient Active Problem List   Diagnosis Date Noted  . Irregular heart beat 06/25/2020  . History of anemia 06/25/2020  . Smoker 06/13/2020  . Iron deficiency anemia due to chronic blood loss 05/13/2020  . Foley catheter problem ([place]) 05/11/2020  . Exposure to radioactive isotopes 05/07/2020  . 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 Hematuria 10/02/2019  . Malignant neoplasm of prostate ([place]) 09/14/2018  . Chest pain 08/29/2018  . History of lung surgery 12/18/2017  . Weight loss 12/18/2017  . Early satiety 07/23/2017  . GERD (gastroesophageal reflux disease) 05/05/2017  . Hypertension 05/05/2017  . Hoarseness of voice 05/05/2017  . Hypokalemia 05/05/2017  . Generalized anxiety disorder 05/05/2017  . BPH (benign prostatic hyperplasia) 03/06/2017  . ED (erectile dysfunction) 03/06/2017  . Allergic rhinitis 12/02/2013  . Asthma 12/02/2013    Past Surgical History:  Procedure Laterality Date  . HERNIA REPAIR    . lung mass exc Left    no records available at this time, clips noted on CXR, pt reports benign mass was removed  . PROSTATECTOMY      Social History   Tobacco Use  . Smoking status: Former Smoker    Types: Cigarettes  . Smokeless tobacco: Never Used  Substance Use Topics  . Alcohol use: Yes    Comment: Occasional    Family History  Problem Relation Age of Onset  . Hypertension Mother   . Hypertension Brother      Current medication list and allergy/intolerance information reviewed:    Current Outpatient Medications  Medication Sig Dispense Refill  . albuterol (VENTOLIN HFA) 108 (90 Base) MCG/ACT inhaler Inhale 1-2 puffs into the lungs every 4 (four) hours as needed for wheezing (FOR EMERGENCY USE). 18 g 99  . benazepril (LOTENSIN) 40 MG tablet Take 1 tablet (40 mg total) by mouth daily. For high blood pressure 90 tablet 3  . cefUROXime (CEFTIN) 500 MG tablet Take by mouth.    . ciprofloxacin (CIPRO) 500 MG  tablet Take 1 tablet (500 mg total) by mouth 2 (two) times daily. 14 tablet 0  . Distilled Water LIQD Use as directed for cancer-related treatments 3780 mL 11  . escitalopram (LEXAPRO) 10 MG tablet Take 1 tablet (10 mg total) by mouth daily. For anxiety / nerves 90 tablet 3  . famotidine (PEPCID) 40 MG tablet Take 1 tablet (40 mg total) by mouth  daily. To prevent stomach ulcers / treat acid reflux 90 tablet 3  . hydrochlorothiazide (HYDRODIURIL) 25 MG tablet Take 1 tablet (25 mg total) by mouth daily. For high blood pressure 90 tablet 3  . hydrocortisone (ANUSOL-HC) 25 MG suppository Place 1 suppository (25 mg total) rectally 2 (two) times daily as needed for hemorrhoids. 24 suppository 2  . iron polysaccharides (NIFEREX) 150 MG capsule Take 1 capsule (150 mg total) by mouth daily. For iron deficiency anemia 90 capsule 3  . metoprolol succinate (TOPROL-XL) 25 MG 24 hr tablet Take 1 tablet (25 mg total) by mouth daily. For blood pressure and to keep heart rate under control 90 tablet 3  . oxybutynin (DITROPAN) 5 MG tablet Take 1 tablet (5 mg total) by mouth 3 (three) times daily. To help with bladder spasm 270 tablet 3  . pantoprazole (PROTONIX) 40 MG tablet Take 1 tablet (40 mg total) by mouth daily. To prevent stomach ulcers / treat acid reflux 90 tablet 3  . tadalafil (CIALIS) 5 MG tablet Take 1 tablet (5 mg total) by mouth daily. For erectile dysfunction 90 tablet 3  . tamsulosin (FLOMAX) 0.4 MG CAPS capsule Take 1 capsule (0.4 mg total) by mouth daily. To help urine flow 90 capsule 3  . amLODipine (NORVASC) 10 MG tablet Take 1 tablet (10 mg total) by mouth daily. 90 tablet 0  . fluconazole (DIFLUCAN) 150 MG tablet Take 1 tablet (150 mg total) by mouth once for 1 dose. Repeat dose 72 hours if urinary burning persists 2 tablet 1   No current facility-administered medications for this visit.    No Known Allergies    Review of Systems:  Constitutional:  No  fever, no chills, + recent illness per HPI  HEENT: No  headache  Cardiac: No  chest pain, No  pressure, No palpitations,   Respiratory:  No  shortness of breath.   Gastrointestinal: No  abdominal pain, No  nausea, No  vomiting, No  diarrhe  Genitourinary: + incontinence, No  abnormal genital bleeding, No abnormal genital discharge, + dysuria, No hematuria   Neurologic:  No   dizziness   Exam:  BP (!) 149/86 (BP Location: Left Arm, Patient Position: Sitting, Cuff Size: Normal)   Pulse (!) 56   Temp 97.8 F (36.6 C) (Oral)   Wt 186 lb 1.9 oz (84.4 kg)   BMI 29.15 kg/m   Constitutional: VS see above. General Appearance: alert, well-developed, well-nourished, NAD  Eyes: Normal lids and conjunctive, non-icteric sclera  Neck: No masses, trachea midline. No thyroid enlargement. No tenderness/mass appreciated. No lymphadenopathy  Respiratory: Normal respiratory effort. no wheeze, no rhonchi, no rales  Cardiovascular: S1/S2 normal, no murmur, no rub/gallop auscultated. RRR. No lower extremity edema.   Gastrointestinal: Nontender, no masses. No hepatomegaly, no splenomegaly. No hernia appreciated. Rectal exam deferred.   Musculoskeletal: Gait normal.   Neurological: Normal balance/coordination. No tremor.   Skin: warm, dry, intact.    Psychiatric: Normal judgment/insight. Normal mood and affect.    No results found for this or any previous visit (from the past 72 hour(s)).  No  results found.     ASSESSMENT/PLAN: The primary encounter diagnosis was Essential hypertension. Diagnoses of Cystitis, Candidal urinary tract infection, Radiation cystitis, and Hematuria syndrome were also pertinent to this visit.   HTN - pt undergoing hyperbaric oxygen treatment which is known to increase blood pressures, especially in patients taking beta blockers.   Continue metoprolol succinate 25 mg  - pt has history of paroxsymal a fib and benefits (rate control) of continuation out weigh risks of continuation (possible eleated BP w/ hyperbaric tx) - may consider input from cardiology if needed   Continue HCTZ 25 mg, benazepril 40 mg  Rx amlodipine 10 mg - hopeful will be able to discontinue this once hyperbaric oxygen therapy no longer needed  Follow up in 2 weeks for bp check and will consider transitioning to combination ACE/HCTZ/amlodipine if good response to  addition of amlodipine  Cystitis - candidal UTI   Rx fluconazole to take WITH Ceftin  Finish out cefuroxime prescription   Referral to urology at [facility] - history of prostate removal   Referral to urology at [facility] per patients request for second opinion  Orders Placed This Encounter  Procedures  . Ambulatory referral to Urology    Meds ordered this encounter  Medications  . DISCONTD: fluconazole (DIFLUCAN) 150 MG tablet    Sig: Take 1 tablet (150 mg total) by mouth once for 1 dose. Repeat dose 72 hours if urinary burning persists    Dispense:  2 tablet    Refill:  1  . DISCONTD: amLODipine (NORVASC) 10 MG tablet    Sig: Take 1 tablet (10 mg total) by mouth daily.    Dispense:  90 tablet    Refill:  0  . amLODipine (NORVASC) 10 MG tablet    Sig: Take 1 tablet (10 mg total) by mouth daily.    Dispense:  90 tablet    Refill:  0  . fluconazole (DIFLUCAN) 150 MG tablet    Sig: Take 1 tablet (150 mg total) by mouth once for 1 dose. Repeat dose 72 hours if urinary burning persists    Dispense:  2 tablet    Refill:  1    Patient Instructions  Plan:  Referral to urology w/ [facility] for second opinion and help w/ managing frequent infections, urinary symptoms, cancer surveillance.   Urine culture from the ER showed yeast, this might be why the Ceftin antibiotic isn't solving symptoms. I've sent fluconazole aka Diflucan to pharmacy to treat this. Hopefully this works better!   For blood pressure: Hyperbaric treatment may increase blood pressure, hopefully when you're done with that we might be able to come off of the new medicines, for now:  CONTINUE metoprolol, benazepril, hydrochlorothiazide  START amlodipine 10 mg pills, take 1/2 tablet (5 mg) daily for the first 4-6 days then increase to whole tablet (10 mg)           Visit summary with medication list and pertinent instructions was printed for patient to review. All questions at time of visit were  answered - patient instructed to contact office with any additional concerns or updates. ER/RTC precautions were reviewed with the patient.    Please note: voice recognition software was used to produce this document, and typos may escape review. Please contact Dr. Sheppard Coil for any needed clarifications.     Follow-up plan: Return in about 2 weeks (around 09/06/2020) for NURSE VISIT RECHECK BP - see [place] sooner if needed.

## 2020-09-03 ENCOUNTER — Telehealth: Payer: Self-pay | Admitting: Neurology

## 2020-09-03 NOTE — Telephone Encounter (Signed)
Patient states [facility] told him he could not get his amlodipine until March 2nd. Didn't tell him why. I called [facility] and they state this was filled by [facility]. [facility] will transfer RX and let patient know once this is ready. I also left vm for patient making him aware.

## 2020-09-06 ENCOUNTER — Ambulatory Visit: Payer: 59

## 2020-09-14 ENCOUNTER — Other Ambulatory Visit: Payer: Self-pay

## 2020-09-14 ENCOUNTER — Ambulatory Visit (INDEPENDENT_AMBULATORY_CARE_PROVIDER_SITE_OTHER): Payer: 59 | Admitting: Medical-Surgical

## 2020-09-14 VITALS — BP 150/87 | HR 70 | Temp 98.0°F | Resp 20 | Ht 67.0 in | Wt 186.0 lb

## 2020-09-14 DIAGNOSIS — I10 Essential (primary) hypertension: Secondary | ICD-10-CM

## 2020-09-14 MED ORDER — METOPROLOL SUCCINATE ER 50 MG PO TB24: 50.0000 mg | ORAL_TABLET | Freq: Every day | ORAL | 3 refills | Status: DC

## 2020-09-14 NOTE — Progress Notes (Signed)
Established Patient Office Visit  Subjective:  Patient ID: Nathan Lang, male    DOB: [birth year]  Age: 50 y.o. MRN: [id number]  CC:  Chief Complaint  Patient presents with  . Hypertension    HPI DOC MANDALA presents for a BP check. First BP 169/88, second BP 150/87.  Past Medical History:  Diagnosis Date  . Asthma 12/02/2013  . BPH (benign prostatic hyperplasia) 03/06/2017  . ED (erectile dysfunction) 03/06/2017  . GERD (gastroesophageal reflux disease) 05/05/2017  . Hypertension 05/05/2017  . Prostate cancer ([employer]) 09/14/2018    Past Surgical History:  Procedure Laterality Date  . HERNIA REPAIR    . lung mass exc Left    no records available at this time, clips noted on CXR, pt reports benign mass was removed  . PROSTATECTOMY      Family History  Problem Relation Age of Onset  . Hypertension Mother   . Hypertension Brother     Social History   Socioeconomic History  . Marital status: Married    Spouse name: Not on file  . Number of children: 3  . Years of education: Not on file  . Highest education level: Not on file  Occupational History  . Not on file  Tobacco Use  . Smoking status: Former Smoker    Types: Cigarettes  . Smokeless tobacco: Never Used  Vaping Use  . Vaping Use: Never used  Substance and Sexual Activity  . Alcohol use: Yes    Comment: Occasional  . Drug use: No  . Sexual activity: Yes    Partners: Female    Birth control/protection: None  Other Topics Concern  . Not on file  Social History Narrative  . Not on file   Social Determinants of Health   Financial Resource Strain: Not on file  Food Insecurity: Not on file  Transportation Needs: Not on file  Physical Activity: Not on file  Stress: Not on file  Social Connections: Not on file  Intimate Partner Violence: Not on file    Outpatient Medications Prior to Visit  Medication Sig Dispense Refill  . albuterol (VENTOLIN HFA) 108 (90 Base) MCG/ACT inhaler Inhale 1-2 puffs into  the lungs every 4 (four) hours as needed for wheezing (FOR EMERGENCY USE). 18 g 99  . amLODipine (NORVASC) 10 MG tablet Take 1 tablet (10 mg total) by mouth daily. 90 tablet 0  . benazepril (LOTENSIN) 40 MG tablet Take 1 tablet (40 mg total) by mouth daily. For high blood pressure 90 tablet 3  . ciprofloxacin (CIPRO) 500 MG tablet Take 1 tablet (500 mg total) by mouth 2 (two) times daily. 14 tablet 0  . Distilled Water LIQD Use as directed for cancer-related treatments 3780 mL 11  . escitalopram (LEXAPRO) 10 MG tablet Take 1 tablet (10 mg total) by mouth daily. For anxiety / nerves 90 tablet 3  . famotidine (PEPCID) 40 MG tablet Take 1 tablet (40 mg total) by mouth daily. To prevent stomach ulcers / treat acid reflux 90 tablet 3  . hydrochlorothiazide (HYDRODIURIL) 25 MG tablet Take 1 tablet (25 mg total) by mouth daily. For high blood pressure 90 tablet 3  . hydrocortisone (ANUSOL-HC) 25 MG suppository Place 1 suppository (25 mg total) rectally 2 (two) times daily as needed for hemorrhoids. 24 suppository 2  . iron polysaccharides (NIFEREX) 150 MG capsule Take 1 capsule (150 mg total) by mouth daily. For iron deficiency anemia 90 capsule 3  . metoprolol succinate (TOPROL-XL) 25 MG 24  hr tablet Take 1 tablet (25 mg total) by mouth daily. For blood pressure and to keep heart rate under control 90 tablet 3  . oxybutynin (DITROPAN) 5 MG tablet Take 1 tablet (5 mg total) by mouth 3 (three) times daily. To help with bladder spasm 270 tablet 3  . pantoprazole (PROTONIX) 40 MG tablet Take 1 tablet (40 mg total) by mouth daily. To prevent stomach ulcers / treat acid reflux 90 tablet 3  . tadalafil (CIALIS) 5 MG tablet Take 1 tablet (5 mg total) by mouth daily. For erectile dysfunction 90 tablet 3  . tamsulosin (FLOMAX) 0.4 MG CAPS capsule Take 1 capsule (0.4 mg total) by mouth daily. To help urine flow 90 capsule 3   No facility-administered medications prior to visit.    No Known Allergies  ROS Review  of Systems    Objective:    Physical Exam  BP (!) 150/87 (BP Location: Right Arm, Patient Position: Sitting, Cuff Size: Normal)   Pulse 70   Temp 98 F (36.7 C) (Temporal)   Resp 20   Ht 5\' 7"  (1.702 m)   Wt 186 lb (84.4 kg)   SpO2 98%   BMI 29.13 kg/m  Wt Readings from Last 3 Encounters:  09/14/20 186 lb (84.4 kg)  08/23/20 186 lb 1.9 oz (84.4 kg)  08/10/20 186 lb (84.4 kg)     Health Maintenance Due  Topic Date Due  . COVID-19 Vaccine (3 - Pfizer risk 4-dose series) 11/14/2019    There are no preventive care reminders to display for this patient.  Lab Results  Component Value Date   TSH 0.99 12/31/2017   Lab Results  Component Value Date   WBC 3.9 05/09/2020   HGB 7.2 (L) 05/09/2020   HCT 21.8 (L) 05/09/2020   MCV 90.1 05/09/2020   PLT 182 05/09/2020   Lab Results  Component Value Date   NA 140 05/09/2020   K 4.0 05/09/2020   CO2 26 05/09/2020   GLUCOSE 97 05/09/2020   BUN 16 05/09/2020   CREATININE 1.07 05/09/2020   BILITOT 0.5 05/09/2020   AST 93 (H) 05/09/2020   ALT 88 (H) 05/09/2020   PROT 5.6 (L) 05/09/2020   CALCIUM 8.9 05/09/2020   Lab Results  Component Value Date   CHOL 158 05/09/2020   Lab Results  Component Value Date   HDL 35 (L) 05/09/2020   Lab Results  Component Value Date   LDLCALC 99 05/09/2020   Lab Results  Component Value Date   TRIG 142 05/09/2020   Lab Results  Component Value Date   CHOLHDL 4.5 05/09/2020   Lab Results  Component Value Date   HGBA1C 4.9 05/09/2020      Assessment & Plan:   Problem List Items Addressed This Visit   None     No orders of the defined types were placed in this encounter.   Follow-up: No follow-ups on file.    Ninfa Meeker, CMA

## 2020-09-14 NOTE — Progress Notes (Signed)
Increase metoprolol to 50mg  daily. Continue amlodipine 10mg  and HCTZ 25mg  daily. Nurse visit BP check in 2 weeks.  Clearnce Sorrel, DNP, APRN, FNP-BC [facility]

## 2020-09-18 ENCOUNTER — Other Ambulatory Visit: Payer: Self-pay

## 2020-09-18 MED ORDER — HYDROCHLOROTHIAZIDE 25 MG PO TABS: 25.0000 mg | ORAL_TABLET | Freq: Every day | ORAL | 3 refills | Status: DC

## 2020-10-04 NOTE — Progress Notes (Deleted)
HPI: FU atrial fibrillation. CTA March 2020 showed calcium score of 10.6 which was 78th percentile, ejection fraction 76% and no evidence of coronary artery disease. Note patient has a history of known anemia and hematuria felt secondary to prostate cancer radiation treatments.  Patient seen in the emergency room at [facility] June 25, 2020 with complaints of increased dyspnea on exertion.  He was found to be in atrial fibrillation.  Echocardiogram January 2022 showed normal LV function, moderate left atrial enlargement, mild to moderate right atrial enlargement.  Current Outpatient Medications  Medication Sig Dispense Refill  . albuterol (VENTOLIN HFA) 108 (90 Base) MCG/ACT inhaler Inhale 1-2 puffs into the lungs every 4 (four) hours as needed for wheezing (FOR EMERGENCY USE). 18 g 99  . amLODipine (NORVASC) 10 MG tablet Take 1 tablet (10 mg total) by mouth daily. 90 tablet 0  . benazepril (LOTENSIN) 40 MG tablet Take 1 tablet (40 mg total) by mouth daily. For high blood pressure 90 tablet 3  . ciprofloxacin (CIPRO) 500 MG tablet Take 1 tablet (500 mg total) by mouth 2 (two) times daily. 14 tablet 0  . Distilled Water LIQD Use as directed for cancer-related treatments 3780 mL 11  . escitalopram (LEXAPRO) 10 MG tablet Take 1 tablet (10 mg total) by mouth daily. For anxiety / nerves 90 tablet 3  . famotidine (PEPCID) 40 MG tablet Take 1 tablet (40 mg total) by mouth daily. To prevent stomach ulcers / treat acid reflux 90 tablet 3  . hydrochlorothiazide (HYDRODIURIL) 25 MG tablet Take 1 tablet (25 mg total) by mouth daily. For high blood pressure 90 tablet 3  . hydrocortisone (ANUSOL-HC) 25 MG suppository Place 1 suppository (25 mg total) rectally 2 (two) times daily as needed for hemorrhoids. 24 suppository 2  . iron polysaccharides (NIFEREX) 150 MG capsule Take 1 capsule (150 mg total) by mouth daily. For iron deficiency anemia 90 capsule 3  . metoprolol succinate (TOPROL-XL) 50 MG 24 hr  tablet Take 1 tablet (50 mg total) by mouth daily. For blood pressure and to keep heart rate under control 90 tablet 3  . oxybutynin (DITROPAN) 5 MG tablet Take 1 tablet (5 mg total) by mouth 3 (three) times daily. To help with bladder spasm 270 tablet 3  . pantoprazole (PROTONIX) 40 MG tablet Take 1 tablet (40 mg total) by mouth daily. To prevent stomach ulcers / treat acid reflux 90 tablet 3  . tadalafil (CIALIS) 5 MG tablet Take 1 tablet (5 mg total) by mouth daily. For erectile dysfunction 90 tablet 3  . tamsulosin (FLOMAX) 0.4 MG CAPS capsule Take 1 capsule (0.4 mg total) by mouth daily. To help urine flow 90 capsule 3   No current facility-administered medications for this visit.     Past Medical History:  Diagnosis Date  . Asthma 12/02/2013  . BPH (benign prostatic hyperplasia) 03/06/2017  . ED (erectile dysfunction) 03/06/2017  . GERD (gastroesophageal reflux disease) 05/05/2017  . Hypertension 05/05/2017  . Prostate cancer ([place]) 09/14/2018    Past Surgical History:  Procedure Laterality Date  . HERNIA REPAIR    . lung mass exc Left    no records available at this time, clips noted on CXR, pt reports benign mass was removed  . PROSTATECTOMY      Social History   Socioeconomic History  . Marital status: Married    Spouse name: Not on file  . Number of children: 3  . Years of education: Not on file  .  Highest education level: Not on file  Occupational History  . Not on file  Tobacco Use  . Smoking status: Former Smoker    Types: Cigarettes  . Smokeless tobacco: Never Used  Vaping Use  . Vaping Use: Never used  Substance and Sexual Activity  . Alcohol use: Yes    Comment: Occasional  . Drug use: No  . Sexual activity: Yes    Partners: Female    Birth control/protection: None  Other Topics Concern  . Not on file  Social History Narrative  . Not on file   Social Determinants of Health   Financial Resource Strain: Not on file  Food Insecurity: Not on file   Transportation Needs: Not on file  Physical Activity: Not on file  Stress: Not on file  Social Connections: Not on file  Intimate Partner Violence: Not on file    Family History  Problem Relation Age of Onset  . Hypertension Mother   . Hypertension Brother     ROS: no fevers or chills, productive cough, hemoptysis, dysphasia, odynophagia, melena, hematochezia, dysuria, hematuria, rash, seizure activity, orthopnea, PND, pedal edema, claudication. Remaining systems are negative.  Physical Exam: Well-developed well-nourished in no acute distress.  Skin is warm and dry.  HEENT is normal.  Neck is supple.  Chest is clear to auscultation with normal expansion.  Cardiovascular exam is regular rate and rhythm.  Abdominal exam nontender or distended. No masses palpated. Extremities show no edema. neuro grossly intact  ECG- personally reviewed  A/P  1 paroxysmal atrial fibrillation-patient remains in sinus rhythm.  Continue Toprol at present dose.  Previous TSH normal and echocardiogram showed normal LV function.  Embolic risk factor of hypertension.  However given history of recurrent hematuria and anemia I do not think he is a good candidate for anticoagulation at this time.  2 hypertension-blood pressure controlled.  Continue present medical regimen and follow.  3 history of prostate cancer/hematuria-follow-up urology.  Kirk Ruths, MD

## 2020-10-10 ENCOUNTER — Ambulatory Visit: Payer: Managed Care, Other (non HMO) | Admitting: Cardiology

## 2020-11-25 ENCOUNTER — Other Ambulatory Visit: Payer: Self-pay | Admitting: Osteopathic Medicine

## 2020-12-07 ENCOUNTER — Other Ambulatory Visit: Payer: Self-pay

## 2020-12-07 ENCOUNTER — Ambulatory Visit (INDEPENDENT_AMBULATORY_CARE_PROVIDER_SITE_OTHER): Payer: 59 | Admitting: Medical-Surgical

## 2020-12-07 ENCOUNTER — Encounter: Payer: Self-pay | Admitting: Medical-Surgical

## 2020-12-07 VITALS — BP 144/98 | HR 85 | Temp 98.4°F | Ht 67.0 in | Wt 193.0 lb

## 2020-12-07 DIAGNOSIS — B9629 Other Escherichia coli [E. coli] as the cause of diseases classified elsewhere: Secondary | ICD-10-CM | POA: Insufficient documentation

## 2020-12-07 DIAGNOSIS — N390 Urinary tract infection, site not specified: Secondary | ICD-10-CM

## 2020-12-07 DIAGNOSIS — M545 Low back pain, unspecified: Secondary | ICD-10-CM

## 2020-12-07 DIAGNOSIS — R1030 Lower abdominal pain, unspecified: Secondary | ICD-10-CM | POA: Diagnosis not present

## 2020-12-07 DIAGNOSIS — R300 Dysuria: Secondary | ICD-10-CM | POA: Diagnosis not present

## 2020-12-07 DIAGNOSIS — N10 Acute tubulo-interstitial nephritis: Secondary | ICD-10-CM

## 2020-12-07 DIAGNOSIS — R310 Gross hematuria: Secondary | ICD-10-CM

## 2020-12-07 HISTORY — DX: Urinary tract infection, site not specified: N39.0

## 2020-12-07 HISTORY — DX: Acute pyelonephritis: N10

## 2020-12-07 HISTORY — DX: Urinary tract infection, site not specified: B96.29

## 2020-12-07 LAB — POCT URINALYSIS DIP (CLINITEK)
Glucose, UA: NEGATIVE mg/dL
Nitrite, UA: POSITIVE — AB
POC PROTEIN,UA: >=300 — AB
Spec Grav, UA: 1.015
Urobilinogen, UA: 4 U/dL — AB
pH, UA: 5.5

## 2020-12-07 NOTE — Progress Notes (Signed)
Subjective:    CC: Kidney pain  HPI: Pleasant 50 year old male with an extensive medical history presenting today for evaluation of back pain.  He has had ongoing issues for approximately 1 last night his symptoms worsened.  Notes he has lower abdominal pain, bilateral back pain, dysuria and hematuria.  He was seen on 5/1 in the ED for flank pain and started on Bactrim.  On 5/13 he was evaluated again in the emergency room and diagnosed with pyelonephritis.  At that time they did run a culture which showed ESBL.  He was prescribed a different oral antibiotic which he completed as instructed.  No fevers, chills, myalgias, or GI symptoms.  I reviewed the past medical history, family history, social history, surgical history, and allergies today and no changes were needed.  Please see the problem list section below in epic for further details.  Past Medical History: Past Medical History:  Diagnosis Date  . Asthma 12/02/2013  . BPH (benign prostatic hyperplasia) 03/06/2017  . ED (erectile dysfunction) 03/06/2017  . GERD (gastroesophageal reflux disease) 05/05/2017  . Hypertension 05/05/2017  . Prostate cancer ([place]) 09/14/2018   Past Surgical History: Past Surgical History:  Procedure Laterality Date  . HERNIA REPAIR    . lung mass exc Left    no records available at this time, clips noted on CXR, pt reports benign mass was removed  . PROSTATECTOMY     Social History: Social History   Socioeconomic History  . Marital status: Married    Spouse name: Not on file  . Number of children: 3  . Years of education: Not on file  . Highest education level: Not on file  Occupational History  . Not on file  Tobacco Use  . Smoking status: Former Smoker    Types: Cigarettes  . Smokeless tobacco: Never Used  Vaping Use  . Vaping Use: Never used  Substance and Sexual Activity  . Alcohol use: Yes    Comment: Occasional  . Drug use: No  . Sexual activity: Yes    Partners: Female    Birth  control/protection: None  Other Topics Concern  . Not on file  Social History Narrative  . Not on file   Social Determinants of Health   Financial Resource Strain: Not on file  Food Insecurity: Not on file  Transportation Needs: Not on file  Physical Activity: Not on file  Stress: Not on file  Social Connections: Not on file   Family History: Family History  Problem Relation Age of Onset  . Hypertension Mother   . Hypertension Brother    Allergies: No Known Allergies Medications: See med rec.  Review of Systems: See HPI for pertinent positives and negatives.   Objective:    General: Well Developed, well nourished, and in no acute distress.  Neuro: Alert and oriented x3.  HEENT: Normocephalic, atraumatic.  Skin: Warm and dry. Cardiac: Regular rate and rhythm, no murmurs rubs or gallops, no lower extremity edema.  Respiratory: Clear to auscultation bilaterally. Not using accessory muscles, speaking in full sentences. Abdomen: Soft, suprapubic tenderness, nondistended. Bowel sounds + x 4 quadrants. No HSM appreciated.  Bilateral CVA tenderness.  Impression and Recommendations:    1. Dysuria 2. Gross hematuria 3. Acute bilateral low back pain without sciatica 4. Lower abdominal pain POCT urinalysis completed in office with positive nitrites, large leukocytes, greater than or equal to 300 protein, large amount of blood, small ketones, large bilirubin.  Sending sample for culture but suspect this will result  with ESBL again.  Reviewed ED records and the culture from 2 weeks ago.  Since his symptoms have not resolved after completing 2 different oral antibiotics, expect he will likely need IV antibiotics for full clearance.  Due to severity of symptoms, sending patient to the emergency room.  He is hemodynamically stable and would prefer to drive himself there.  He was concerned that there may be some confusion so I provided him a letter with a synopsis of the findings and  recommendations from today. - POCT URINALYSIS DIP (CLINITEK) - Urine Culture  Return if symptoms worsen or fail to improve. ___________________________________________ Clearnce Sorrel, DNP, APRN, FNP-BC [facility] [place]

## 2020-12-09 LAB — URINE CULTURE
MICRO NUMBER:: 11946132
SPECIMEN QUALITY:: ADEQUATE

## 2020-12-14 ENCOUNTER — Inpatient Hospital Stay: Payer: 59 | Admitting: Medical-Surgical

## 2020-12-24 ENCOUNTER — Ambulatory Visit: Payer: 59 | Admitting: Osteopathic Medicine

## 2021-03-13 ENCOUNTER — Other Ambulatory Visit: Payer: Self-pay

## 2021-03-13 MED ORDER — ESCITALOPRAM OXALATE 10 MG PO TABS: 10.0000 mg | ORAL_TABLET | Freq: Every day | ORAL | 0 refills | Status: DC

## 2021-04-18 ENCOUNTER — Other Ambulatory Visit: Payer: Self-pay | Admitting: Osteopathic Medicine

## 2021-06-09 ENCOUNTER — Other Ambulatory Visit: Payer: Self-pay | Admitting: Osteopathic Medicine

## 2021-06-11 NOTE — Telephone Encounter (Signed)
Pls call pt to schedule appt to establish with PCP. Also no additional refills on this medication. Sent 30 day refill

## 2021-06-11 NOTE — Telephone Encounter (Signed)
Mailbox Full. Will attempt again later. AM

## 2021-06-13 ENCOUNTER — Other Ambulatory Visit: Payer: Self-pay | Admitting: Medical-Surgical

## 2021-07-12 ENCOUNTER — Other Ambulatory Visit: Payer: Self-pay | Admitting: Neurology

## 2021-07-12 MED ORDER — FAMOTIDINE 40 MG PO TABS: 40.0000 mg | ORAL_TABLET | Freq: Every day | ORAL | 0 refills | Status: DC

## 2021-07-12 NOTE — Telephone Encounter (Signed)
Famotidine 90 day supply sent to [facility] Needs appt to establish care with new provider

## 2021-07-12 NOTE — Telephone Encounter (Signed)
Scheduled transfer of care with Joy on 2/17 at 3pm

## 2021-08-10 IMAGING — CT CT ABD-PEL WO/W CM
3 of 10 series · 12 of 46 positions shown, 18 images · IV contrast (APPLIED)
Comparison: None

CLINICAL DATA: Gross hematuria with history of prostate cancer

EXAM:
CT ABDOMEN AND PELVIS WITHOUT AND WITH CONTRAST
TECHNIQUE: Multidetector CT imaging of the abdomen and pelvis was performed
following the standard protocol before and following the bolus
administration of intravenous contrast.
CONTRAST:  125mL OMNIPAQUE IOHEXOL 300 MG/ML  SOLN

[Series 2: axial pre · axial · non-contrast · 0.84mm/px · z∈[+698,+828]mm · 3 of 93 slices shown]
[im 14/93  soft-tissue]
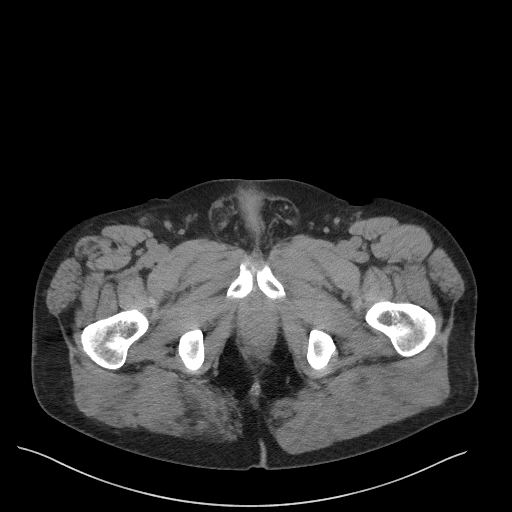
[im 27/93  soft-tissue]
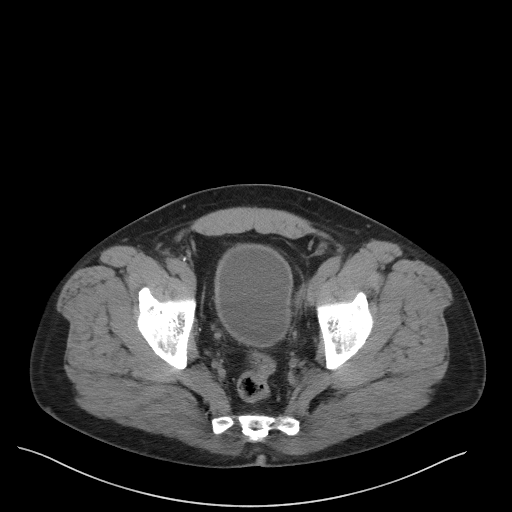
[im 40/93  soft-tissue]
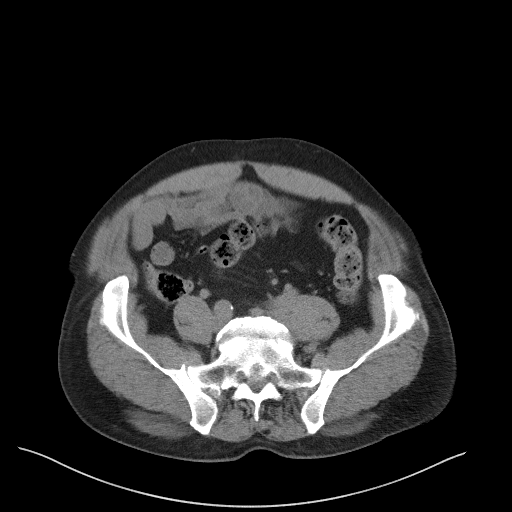

[Series 12: axial delay · axial · delayed · 0.77mm/px · z∈[-437,-77]mm · 7 of 97 slices shown, 12 images]
[im 13/97  soft-tissue]
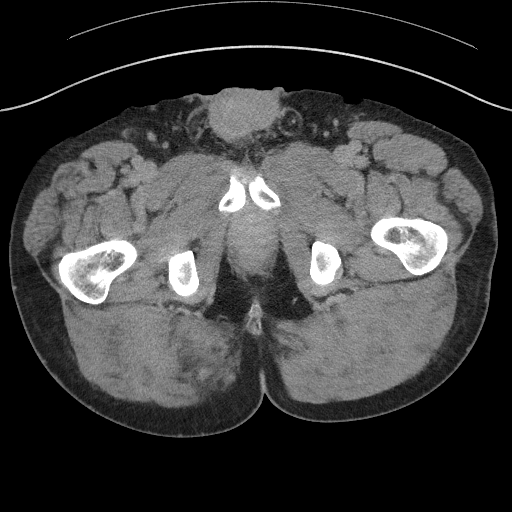
[im 13/97  bone]
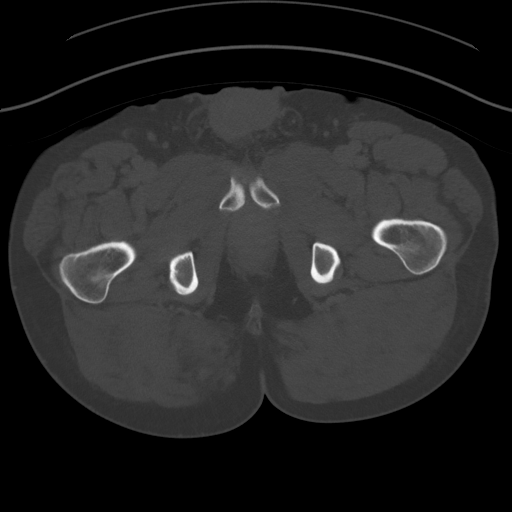
[im 25/97  soft-tissue]
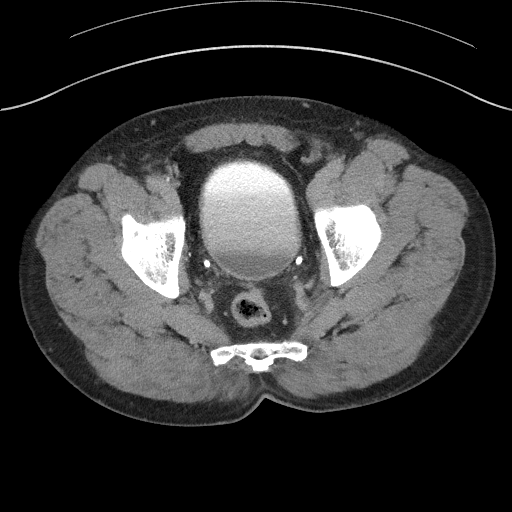
[im 37/97  soft-tissue]
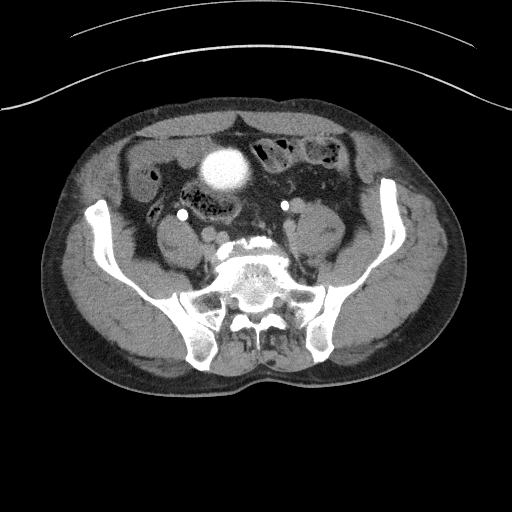
[im 49/97  soft-tissue]
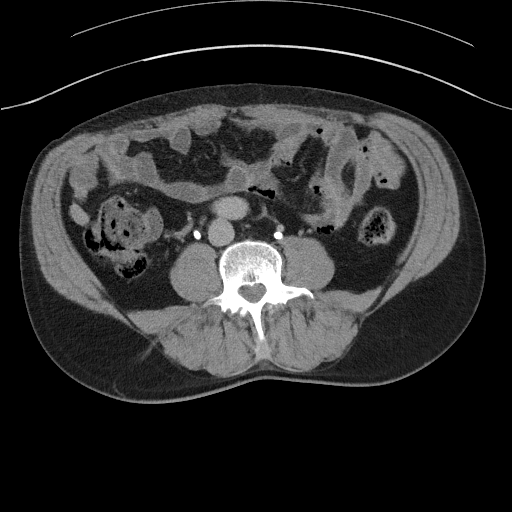
[im 49/97  lung]
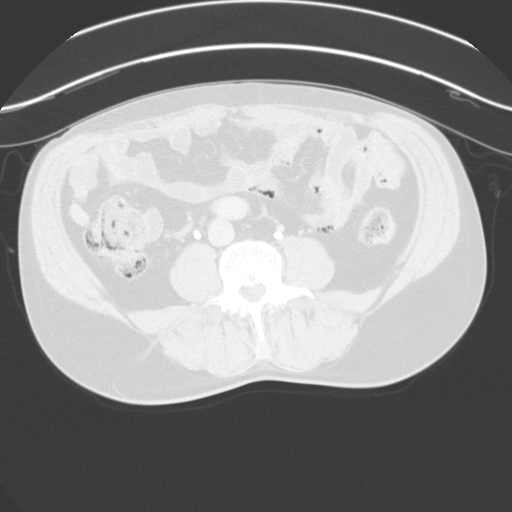
[im 61/97  soft-tissue]
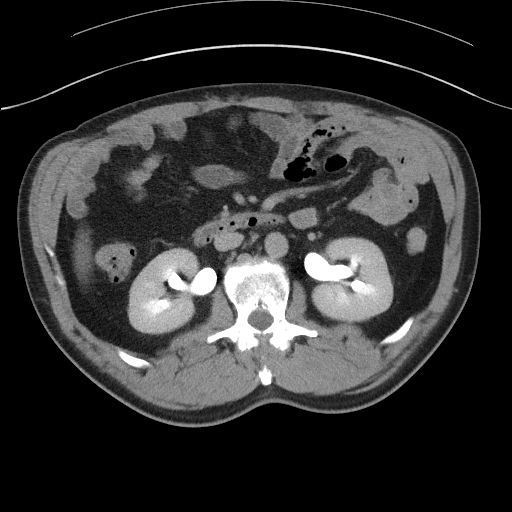
[im 61/97  lung]
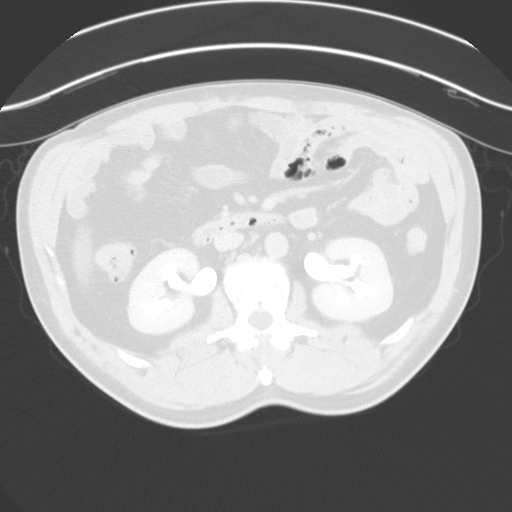
[im 73/97  soft-tissue]
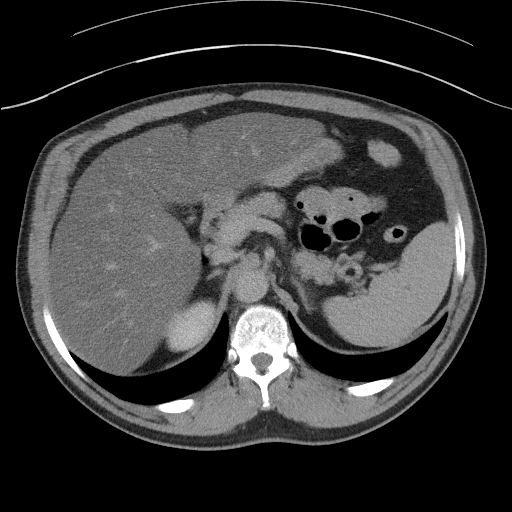
[im 73/97  lung]
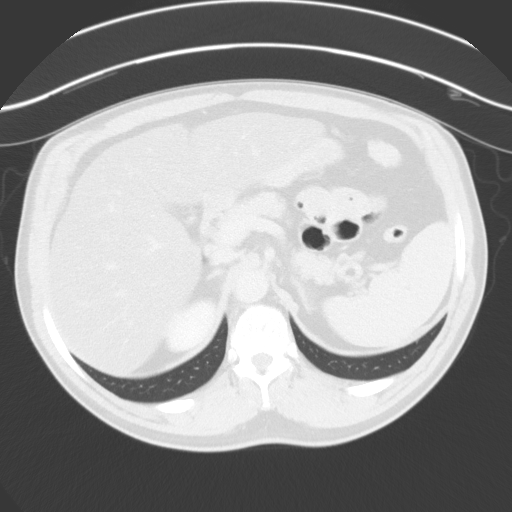
[im 85/97  soft-tissue]
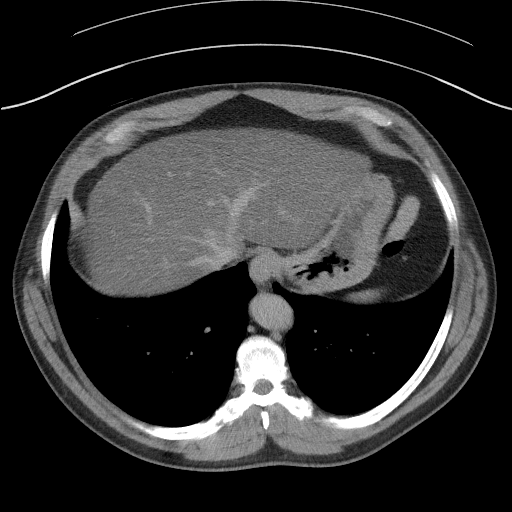
[im 85/97  lung]
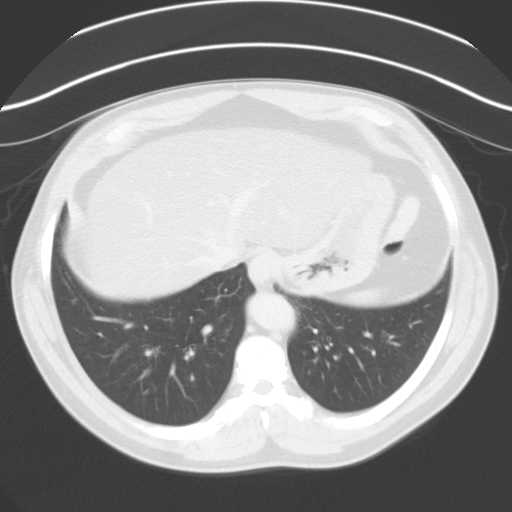

[Series 13: coronal delay · coronal · delayed · 0.85mm/px · 2 of 104 slices shown, 3 images]
[im 35/104  soft-tissue]
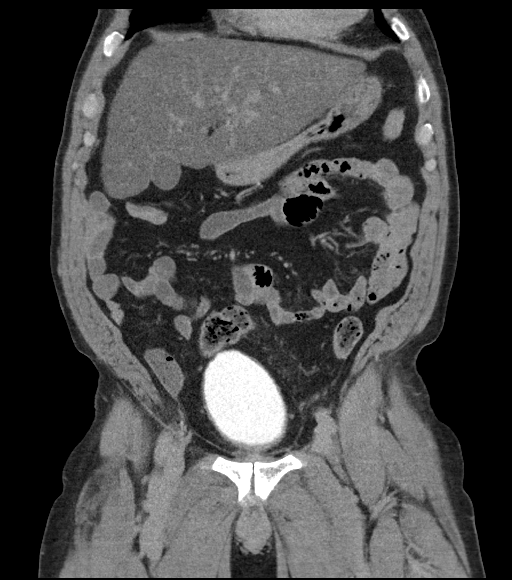
[im 35/104  bone]
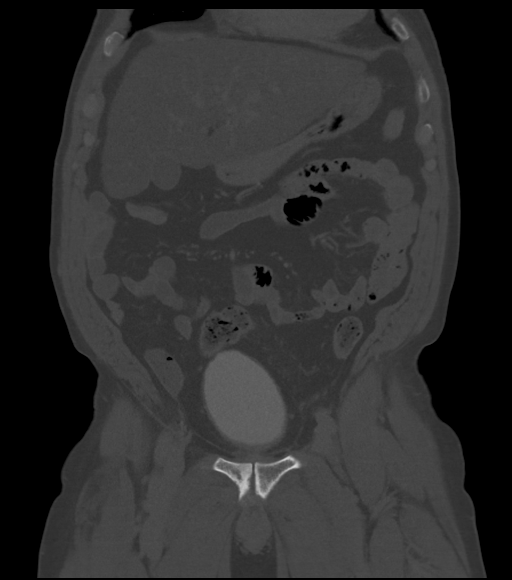
[im 69/104  soft-tissue]
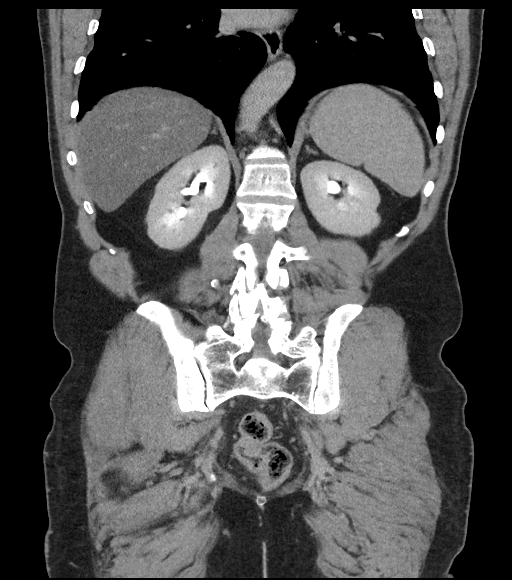

[12 of 46 positions shown; findings below may reference images not displayed]

FINDINGS: Lower chest: Incidental imaging of the lung bases without
consolidation or evidence of pleural effusion.

Hepatobiliary: Marked hepatic steatosis. No pericholecystic
stranding.

Pancreas: Pancreas is normal without focal lesion or ductal
dilation. No peripancreatic stranding.

Spleen: Spleen normal size without focal lesion.

Adrenals/Urinary Tract: Adrenal glands are normal.

Question baseline increased density in the urinary tract bilaterally
perhaps related to blood in the urinary tract. Post prostatectomy
with descent of bladder base. Urinary bladder with limited
assessment. 2 mm calculus in the lower pole the LEFT kidney. No sign
of hydronephrosis. No visible enhancing lesion

Delayed images without signs of filling defect in the upper tracts
to suggest urothelial lesion. Urinary bladder with limited
assessment without gross lesion.

Stomach/Bowel: Stomach is unremarkable. Small bowel is normal. The
appendix is normal. No visible signs of inflammation related to the
gastrointestinal tract.

Vascular/Lymphatic: No aneurysmal dilation. No significant
atherosclerosis. No adenopathy in the upper abdomen or in the
retroperitoneum. Is

Reproductive: Prostatectomy changes. No sign of pelvic
lymphadenopathy.

Other: No abdominal wall hernia or abnormality. No abdominopelvic
ascites.

Musculoskeletal: No acute or destructive bone process. Pars defects
at L4 with grade 1 anterolisthesis of L4 on L5.
IMPRESSION: 1. Baseline increased density in the bilateral renal pelves could
represent blood in the urinary tract but because it is seen
bilaterally a small amount of contrast administered inadvertently
certainly a possibility. No sign of upper tract lesion.
2. 2 mm calculus in the lower pole the LEFT kidney. No sign of
hydronephrosis.
3. Marked hepatic steatosis. Correlate with any clinical evidence of
liver dysfunction.
4. Post prostatectomy with descent of the bladder base.
5. No signs of pelvic lymphadenopathy.
6. Pars defects at L4 with grade 1 anterolisthesis of L4 on L5.

## 2021-08-16 ENCOUNTER — Other Ambulatory Visit: Payer: Self-pay

## 2021-08-16 MED ORDER — POLYSACCHARIDE IRON COMPLEX 150 MG PO CAPS: 150.0000 mg | ORAL_CAPSULE | Freq: Every day | ORAL | 0 refills | Status: DC

## 2021-08-19 ENCOUNTER — Other Ambulatory Visit: Payer: Self-pay | Admitting: Family Medicine

## 2021-08-30 ENCOUNTER — Ambulatory Visit: Payer: 59 | Admitting: Medical-Surgical

## 2021-08-30 ENCOUNTER — Encounter: Payer: Self-pay | Admitting: Medical-Surgical

## 2021-08-30 ENCOUNTER — Other Ambulatory Visit: Payer: Self-pay

## 2021-08-30 VITALS — BP 145/85 | HR 63 | Resp 20 | Ht 67.0 in | Wt 196.8 lb

## 2021-08-30 DIAGNOSIS — I480 Paroxysmal atrial fibrillation: Secondary | ICD-10-CM

## 2021-08-30 DIAGNOSIS — R338 Other retention of urine: Secondary | ICD-10-CM

## 2021-08-30 DIAGNOSIS — I10 Essential (primary) hypertension: Secondary | ICD-10-CM | POA: Diagnosis not present

## 2021-08-30 DIAGNOSIS — J4540 Moderate persistent asthma, uncomplicated: Secondary | ICD-10-CM | POA: Diagnosis not present

## 2021-08-30 DIAGNOSIS — F411 Generalized anxiety disorder: Secondary | ICD-10-CM

## 2021-08-30 DIAGNOSIS — N401 Enlarged prostate with lower urinary tract symptoms: Secondary | ICD-10-CM

## 2021-08-30 DIAGNOSIS — K219 Gastro-esophageal reflux disease without esophagitis: Secondary | ICD-10-CM

## 2021-08-30 MED ORDER — ALBUTEROL SULFATE HFA 108 (90 BASE) MCG/ACT IN AERS: 1.0000 | INHALATION_SPRAY | RESPIRATORY_TRACT | 99 refills | Status: DC | PRN

## 2021-08-30 MED ORDER — FLUTICASONE-SALMETEROL 100-50 MCG/ACT IN AEPB: 1.0000 | INHALATION_SPRAY | Freq: Two times a day (BID) | RESPIRATORY_TRACT | 1 refills | Status: DC

## 2021-08-30 MED ORDER — HYDROCHLOROTHIAZIDE 25 MG PO TABS: 25.0000 mg | ORAL_TABLET | Freq: Every day | ORAL | 3 refills | Status: DC

## 2021-08-30 MED ORDER — TAMSULOSIN HCL 0.4 MG PO CAPS: 0.4000 mg | ORAL_CAPSULE | Freq: Every day | ORAL | 3 refills | Status: DC

## 2021-08-30 MED ORDER — FAMOTIDINE 40 MG PO TABS: 40.0000 mg | ORAL_TABLET | Freq: Every day | ORAL | 0 refills | Status: DC

## 2021-08-30 MED ORDER — ESCITALOPRAM OXALATE 10 MG PO TABS: ORAL_TABLET | ORAL | 0 refills | Status: DC

## 2021-08-30 MED ORDER — BUDESONIDE 180 MCG/ACT IN AEPB: 1.0000 | INHALATION_SPRAY | Freq: Two times a day (BID) | RESPIRATORY_TRACT | 6 refills | Status: DC

## 2021-08-30 NOTE — Patient Instructions (Signed)
Start Advair inhaler 1 puff twice daily. Ok to use Albuterol as needed.

## 2021-08-30 NOTE — Progress Notes (Signed)
°  HPI with pertinent ROS:   CC: transfer of care  HPI: Pleasant 51 year old male presenting today to transfer care to a new PCP and for the following:  HTN- checking BP at home with readings in the 130-140/70-80 range. Taking amlodipine 10mg , Benazepril 40mg , HCTZ 25mg , and Toprol XL 75 mg daily, tolerating well without side effects. Notes some lower extremity edema when he spends a lot of time on his feet all day. Denies CP, SOB, palpitations,  dizziness, headaches, or vision changes.  Mood- taking Lexapro 10mg  daily, tolerating well without side effects. Feels the medication is helping and that his mood is good. Denies SI/HI.   Urinary problem- has been having recurrent UTIs. Just finishing his most recent round of antibiotics. Followed by Urology. Needs a refill on Flomax.   Asthma- using his albuterol inhaler on average 5 days out of every week. Some days does not have to use it at all and others may have to use it more than once. Has not tried a controller medication such as Advair, Symbicort, etc.   A-fib- taking Toprol XL for rate control. Not followed by Cardiology but is open to a referral.   GERD- taking Protonix 40mg  daily and Famotidine 40mg  daily. Tolerating both well without side effects. Wants to know which is the best one to be on and would like to try stopping the other one. Symptoms currently well controlled.   I reviewed the past medical history, family history, social history, surgical history, and allergies today and no changes were needed.  Please see the problem list section below in epic for further details.   Physical exam:   General: Well Developed, well nourished, and in no acute distress.  Neuro: Alert and oriented x3.  HEENT: Normocephalic, atraumatic.  Skin: Warm and dry. Cardiac: Regular rate and irregular rhythm, no murmurs rubs or gallops, no lower extremity edema.  Respiratory: Clear to auscultation bilaterally. Not using accessory muscles, speaking in  full sentences.  Impression and Recommendations:    1. Paroxysmal atrial fibrillation (HCC) In office EKG- rate 53, A fib with slow ventricular response, normal axis, possible left ventricular hypertrophy. Currently asymptomatic. Referring to cardiology.  - EKG 12-Lead - Ambulatory referral to Cardiology  2. Primary hypertension Home BP readings at goal. Continue current medications. Referring to cardiology.  - Ambulatory referral to Cardiology  3. Moderate persistent asthma without complication Continue prn albuterol. Adding Budesonide 1-2 puffs twice daily.   4. Gastroesophageal reflux disease without esophagitis Discussed current medications. Stopping Protonix. Continue Famotidine 40mg  daily. If symptoms return, may need to resume a lower dose PPI.   5. Benign prostatic hyperplasia with urinary retention Continue Flomax. Refills sent. Follow up with Urology as instructed.  6. Generalized anxiety disorder Continue Lexapro 10mg  daily.   Return in about 3 months (around 11/27/2021) for A fib/HTN follow up. ___________________________________________ Clearnce Sorrel, DNP, APRN, FNP-BC [facility] [place]

## 2021-09-20 ENCOUNTER — Other Ambulatory Visit: Payer: Self-pay | Admitting: Family Medicine

## 2021-10-07 ENCOUNTER — Other Ambulatory Visit: Payer: Self-pay | Admitting: Physician Assistant

## 2021-10-25 ENCOUNTER — Other Ambulatory Visit: Payer: Self-pay | Admitting: Neurology

## 2021-10-25 MED ORDER — METOPROLOL SUCCINATE ER 50 MG PO TB24: ORAL_TABLET | ORAL | 0 refills | Status: DC

## 2021-10-30 NOTE — Progress Notes (Deleted)
HPI:FU atrial fibrillation.  CTA March 2020 showed calcium score of 10.6 which was 78th percentile, ejection fraction 76% and no evidence of coronary artery disease. Note patient has a history of known hematuria felt secondary to prostate cancer radiation treatments.  Patient seen in the emergency room at [facility] June 25, 2020 with complaints of increased dyspnea on exertion.  He was found to be in atrial fibrillation.  Echocardiogram January 2022 showed normal LV function, moderate left atrial enlargement, mild to moderate right atrial enlargement.  Since last seen  Current Outpatient Medications  Medication Sig Dispense Refill   albuterol (VENTOLIN HFA) 108 (90 Base) MCG/ACT inhaler Inhale 1-2 puffs into the lungs every 4 (four) hours as needed for wheezing (FOR EMERGENCY USE). 18 g 99   budesonide (PULMICORT) 180 MCG/ACT inhaler Inhale 1-2 puffs into the lungs 2 (two) times daily. 1 each 6   Distilled Water LIQD Use as directed for cancer-related treatments 3780 mL 11   escitalopram (LEXAPRO) 10 MG tablet TAKE 1 TABLET(10 MG) BY MOUTH DAILY FOR ANXIETY OR NERVES 30 tablet 2   famotidine (PEPCID) 40 MG tablet Take 1 tablet (40 mg total) by mouth daily. To prevent stomach ulcers / treat acid reflux 90 tablet 0   hydrochlorothiazide (HYDRODIURIL) 25 MG tablet Take 1 tablet (25 mg total) by mouth daily. For high blood pressure 90 tablet 3   iron polysaccharides (NIFEREX) 150 MG capsule Take 1 capsule (150 mg total) by mouth daily. For iron deficiency anemia 90 capsule 0   metoprolol succinate (TOPROL-XL) 25 MG 24 hr tablet TAKE 1 TABLET BY MOUTH DAILY FOR BLOOD PRESSURE AND TO KEEP HEART RATE UNDER CONTROL 90 tablet 1   metoprolol succinate (TOPROL-XL) 50 MG 24 hr tablet TAKE 1 TABLET BY MOUTH DAILY FOR BLOOD PRESSURE AND TO KEEP HEART RATE UNDER CONTROL 90 tablet 0   tamsulosin (FLOMAX) 0.4 MG CAPS capsule Take 1 capsule (0.4 mg total) by mouth daily. To help urine flow 90 capsule 3   No  current facility-administered medications for this visit.     Past Medical History:  Diagnosis Date   Asthma 12/02/2013   BPH (benign prostatic hyperplasia) 03/06/2017   ED (erectile dysfunction) 03/06/2017   GERD (gastroesophageal reflux disease) 05/05/2017   Hypertension 05/05/2017   Prostate cancer ([place]) 09/14/2018    Past Surgical History:  Procedure Laterality Date   HERNIA REPAIR     lung mass exc Left    no records available at this time, clips noted on CXR, pt reports benign mass was removed   PROSTATECTOMY      Social History   Socioeconomic History   Marital status: Married    Spouse name: Not on file   Number of children: 3   Years of education: Not on file   Highest education level: Not on file  Occupational History   Not on file  Tobacco Use   Smoking status: Former    Types: Cigarettes   Smokeless tobacco: Never  Vaping Use   Vaping Use: Never used  Substance and Sexual Activity   Alcohol use: Yes    Comment: Occasional   Drug use: No   Sexual activity: Yes    Partners: Female    Birth control/protection: None  Other Topics Concern   Not on file  Social History Narrative   Not on file   Social Determinants of Health   Financial Resource Strain: Not on file  Food Insecurity: Not on file  Transportation  Needs: Not on file  Physical Activity: Not on file  Stress: Not on file  Social Connections: Not on file  Intimate Partner Violence: Not on file    Family History  Problem Relation Age of Onset   Hypertension Mother    Hypertension Brother     ROS: no fevers or chills, productive cough, hemoptysis, dysphasia, odynophagia, melena, hematochezia, dysuria, hematuria, rash, seizure activity, orthopnea, PND, pedal edema, claudication. Remaining systems are negative.  Physical Exam: Well-developed well-nourished in no acute distress.  Skin is warm and dry.  HEENT is normal.  Neck is supple.  Chest is clear to auscultation with normal expansion.   Cardiovascular exam is regular rate and rhythm.  Abdominal exam nontender or distended. No masses palpated. Extremities show no edema. neuro grossly intact  ECG-August 30, 2021-atrial fibrillation, lateral T wave inversion.  Personally reviewed  A/P  1 paroxysmal atrial fibrillation-patient remains in atrial fibrillation on exam.  We will continue Toprol for rate control.  Note LV function is normal on echocardiogram.  Patient does have a history of significant hematuria/anemia and therefore I do not think he is a good candidate for anticoagulation.  His CHA2DS2-VASc is 1 for hypertension.  Given that he is asymptomatic I think that rate control is best option long-term.  Watchman  2 hypertension-blood pressure controlled.  Continue present medical regimen.  3 prostate cancer/hematuria-managed by urology.  Kirk Ruths, MD

## 2021-11-04 ENCOUNTER — Ambulatory Visit: Payer: 59 | Admitting: Cardiology

## 2021-11-29 ENCOUNTER — Ambulatory Visit (INDEPENDENT_AMBULATORY_CARE_PROVIDER_SITE_OTHER): Payer: Self-pay | Admitting: Medical-Surgical

## 2021-11-29 DIAGNOSIS — Z91199 Patient's noncompliance with other medical treatment and regimen due to unspecified reason: Secondary | ICD-10-CM

## 2021-11-29 NOTE — Progress Notes (Signed)
   Complete physical exam  Patient: Nathan Lang   DOB: 05/03/2000   51 y.o. Male  MRN: [id number]  Subjective:    No chief complaint on file.   Nathan Lang is a 51 y.o. male who presents today for a complete physical exam. She reports consuming a {diet types:17450} diet. {types:19826} She generally feels {DESC; WELL/FAIRLY WELL/POORLY:18703}. She reports sleeping {DESC; WELL/FAIRLY WELL/POORLY:18703}. She {does/does not:200015} have additional problems to discuss today.    Most recent fall risk assessment:    01/08/2022   10:42 AM  Fall Risk   Falls in the past year? 0  Number falls in past yr: 0  Injury with Fall? 0  Risk for fall due to : No Fall Risks  Follow up Falls evaluation completed     Most recent depression screenings:    01/08/2022   10:42 AM 11/29/2020   10:46 AM  PHQ 2/9 Scores  PHQ - 2 Score 0 0  PHQ- 9 Score 5     {VISON DENTAL STD PSA (Optional):27386}  {History (Optional):23778}  Patient Care Team: Kalayna Noy, NP as PCP - General (Nurse Practitioner)   Outpatient Medications Prior to Visit  Medication Sig   fluticasone (FLONASE) 50 MCG/ACT nasal spray Place 2 sprays into both nostrils in the morning and at bedtime. After 7 days, reduce to once daily.   norgestimate-ethinyl estradiol (SPRINTEC 28) 0.25-35 MG-MCG tablet Take 1 tablet by mouth daily.   Nystatin POWD Apply liberally to affected area 2 times per day   spironolactone (ALDACTONE) 100 MG tablet Take 1 tablet (100 mg total) by mouth daily.   No facility-administered medications prior to visit.    ROS        Objective:     There were no vitals taken for this visit. {Vitals History (Optional):23777}  Physical Exam   No results found for any visits on 02/13/22. {Show previous labs (optional):23779}    Assessment & Plan:    Routine Health Maintenance and Physical Exam  Immunization History  Administered Date(s) Administered   DTaP 07/17/1999, 09/12/1999,  11/21/1999, 08/06/2000, 02/20/2004   Hepatitis A 12/17/2007, 12/22/2008   Hepatitis B 05/04/1999, 06/11/1999, 11/21/1999   HiB (PRP-OMP) 07/17/1999, 09/12/1999, 11/21/1999, 08/06/2000   IPV 07/17/1999, 09/12/1999, 05/11/2000, 02/20/2004   Influenza,inj,Quad PF,6+ Mos 03/24/2014   Influenza-Unspecified 06/23/2012   MMR 05/11/2001, 02/20/2004   Meningococcal Polysaccharide 12/22/2011   Pneumococcal Conjugate-13 08/06/2000   Pneumococcal-Unspecified 11/21/1999, 02/04/2000   Tdap 12/22/2011   Varicella 05/11/2000, 12/17/2007    Health Maintenance  Topic Date Due   HIV Screening  Never done   Hepatitis C Screening  Never done   INFLUENZA VACCINE  02/11/2022   PAP-Cervical Cytology Screening  02/13/2022 (Originally 05/02/2020)   PAP SMEAR-Modifier  02/13/2022 (Originally 05/02/2020)   TETANUS/TDAP  02/13/2022 (Originally 12/21/2021)   HPV VACCINES  Discontinued   COVID-19 Vaccine  Discontinued    Discussed health benefits of physical activity, and encouraged her to engage in regular exercise appropriate for her age and condition.  Problem List Items Addressed This Visit   None Visit Diagnoses     Annual physical exam    -  Primary   Cervical cancer screening       Need for Tdap vaccination          No follow-ups on file.     Reed Dady, NP   

## 2021-12-18 ENCOUNTER — Other Ambulatory Visit: Payer: Self-pay | Admitting: Medical-Surgical

## 2022-01-08 ENCOUNTER — Other Ambulatory Visit: Payer: Self-pay

## 2022-01-08 MED ORDER — FAMOTIDINE 40 MG PO TABS: 40.0000 mg | ORAL_TABLET | Freq: Every day | ORAL | 0 refills | Status: DC

## 2022-01-15 ENCOUNTER — Other Ambulatory Visit: Payer: Self-pay

## 2022-01-15 MED ORDER — ESCITALOPRAM OXALATE 10 MG PO TABS: ORAL_TABLET | ORAL | 2 refills | Status: DC

## 2022-02-06 DIAGNOSIS — H25812 Combined forms of age-related cataract, left eye: Secondary | ICD-10-CM | POA: Insufficient documentation

## 2022-02-28 ENCOUNTER — Ambulatory Visit (INDEPENDENT_AMBULATORY_CARE_PROVIDER_SITE_OTHER): Payer: 59 | Admitting: Family Medicine

## 2022-02-28 ENCOUNTER — Encounter: Payer: Self-pay | Admitting: Family Medicine

## 2022-02-28 ENCOUNTER — Other Ambulatory Visit: Payer: Self-pay

## 2022-02-28 VITALS — BP 121/82 | HR 70 | Ht 67.0 in | Wt 202.0 lb

## 2022-02-28 DIAGNOSIS — R109 Unspecified abdominal pain: Secondary | ICD-10-CM

## 2022-02-28 DIAGNOSIS — R7989 Other specified abnormal findings of blood chemistry: Secondary | ICD-10-CM | POA: Diagnosis not present

## 2022-02-28 LAB — POCT URINALYSIS DIP (CLINITEK)
Bilirubin, UA: NEGATIVE
Glucose, UA: NEGATIVE mg/dL
Ketones, POC UA: NEGATIVE mg/dL
Nitrite, UA: NEGATIVE
POC PROTEIN,UA: 100 — AB
Spec Grav, UA: >=1.03 — AB (ref 1.010–1.025)
Urobilinogen, UA: 0.2 E.U./dL
pH, UA: 6.5 (ref 5.0–8.0)

## 2022-02-28 MED ORDER — CYCLOBENZAPRINE HCL 10 MG PO TABS: 10.0000 mg | ORAL_TABLET | Freq: Three times a day (TID) | ORAL | 0 refills | Status: DC | PRN

## 2022-02-28 NOTE — Patient Instructions (Signed)
Sent over a muscle relaxer for you to try.  Muscle relaxers can make you feel sleepy and tired so just be aware that you may want to wait until you come home from work to take 1 or either to take a half a tab. Recommend 2 extra strength Tylenol up to 3 times a day for pain relief. Try cool packs or icing the area to see if that helps with pain.  Or can try heat as well.  Sometimes heat relaxes the muscle and is helpful. If you are not improving after the weekend then let us know as we may need to do for some further work-up at that point.

## 2022-02-28 NOTE — Progress Notes (Signed)
Acute Office Visit  Subjective:     Patient ID: Nathan Lang, male    DOB: June 08, 1971, 51 y.o.   MRN: [id number]  Chief Complaint  Patient presents with   Flank Pain    HPI Patient is in today for left side pain x4 days.  He denies any known injury or trauma he has not been doing any heavy lifting in fact he has been on light duty at work because he just had eye surgery.  He says it comes and goes he does not feel nauseated.  He says his bowels are somewhat irregular but he has not noticed any major changes.  He has had chronic intermittent hematuria that is not new though he says lately he actually has not had any blood in his urine until this morning and he noticed he passed several blood clots.  Denies any fevers chills or sweats.  Ports pain is worse with flexion and rotation and side bending.  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  He did have a urinary tract infection back in May.  Lab Results  Component Value Date   CREATININE 1.07 05/09/2020   CREATININE 1.15 11/22/2019   CREATININE 0.90 07/22/2019     ROS      Objective:    BP 121/82   Pulse 70   Ht '5\' 7"'$  (1.702 m)   Wt 202 lb (91.6 kg)   SpO2 99%   BMI 31.64 kg/m    Physical Exam Vitals reviewed.  Abdominal:     General: Bowel sounds are normal. There is no distension.     Palpations: Abdomen is soft.     Tenderness: There is no abdominal tenderness. There is no rebound.       Comments: Area of pain indicated above.  Its below the rib cage but above the hip bone and the soft area.  It does not radiate it is fairly localized.  It is tender with palpation.  No mass or swelling.     Results for orders placed or performed in visit on 02/28/22  POCT URINALYSIS DIP (CLINITEK)  Result Value Ref Range   Color, UA brown (A) yellow   Clarity, UA cloudy (A) clear   Glucose, UA negative negative mg/dL   Bilirubin, UA negative negative   Ketones, POC UA negative  negative mg/dL   Spec Grav, UA >=1.030 (A) 1.010 - 1.025   Blood, UA large (A) negative   pH, UA 6.5 5.0 - 8.0   POC PROTEIN,UA =100 (A) negative, trace   Urobilinogen, UA 0.2 0.2 or 1.0 E.U./dL   Nitrite, UA Negative Negative   Leukocytes, UA Small (1+) (A) Negative        Assessment & Plan:   Problem List Items Addressed This Visit   None Visit Diagnoses     Left sided abdominal pain    -  Primary   Relevant Medications   cyclobenzaprine (FLEXERIL) 10 MG tablet   Other Relevant Orders   CBC with Differential/Platelet   COMPLETE METABOLIC PANEL WITH GFR   Lipase   POCT URINALYSIS DIP (CLINITEK) (Completed)   Urine Culture      Left-sided abdominal pain its not in the location of the bladder or the kidney it is mostly over muscle area.  No nausea fever or change in bowels.  Urinalysis is negative for nitrates.  Suspect muscle strain at this point.  Though consider radiculopathy as well, though no pain directly over  the spine.  Recommend a trial of muscle relaxer and Tylenol and ice and/or heat for pain relief.  We will get some labs just to rule out infection with elevated white blood cell count and lipase since it is left-sided rule out pancreatitis.  If he is not improving over the next week then recommend further imaging especially with the prior history of prostate cancer.   Sent over a muscle relaxer for you to try.  Muscle relaxers can make you feel sleepy and tired so just be aware that you may want to wait until you come home from work to take 1 or either to take a half a tab. Recommend 2 extra strength Tylenol up to 3 times a day for pain relief. Try cool packs or icing the area to see if that helps with pain.  Or can try heat as well.  Sometimes heat relaxes the muscle and is helpful. If you are not improving after the weekend then let us know as we may need to do for some further work-up at that point.  Meds ordered this encounter  Medications   cyclobenzaprine  (FLEXERIL) 10 MG tablet    Sig: Take 1 tablet (10 mg total) by mouth 3 (three) times daily as needed for muscle spasms.    Dispense:  20 tablet    Refill:  0    No follow-ups on file.  Beatrice Lecher, MD

## 2022-03-01 LAB — COMPLETE METABOLIC PANEL WITH GFR
AG Ratio: 1.7 (calc) (ref 1.0–2.5)
ALT: 160 U/L — ABNORMAL HIGH (ref 9–46)
AST: 82 U/L — ABNORMAL HIGH (ref 10–35)
Albumin: 4.6 g/dL (ref 3.6–5.1)
Alkaline phosphatase (APISO): 102 U/L (ref 35–144)
BUN/Creatinine Ratio: 16 (calc) (ref 6–22)
BUN: 28 mg/dL — ABNORMAL HIGH (ref 7–25)
CO2: 25 mmol/L (ref 20–32)
Calcium: 9.8 mg/dL (ref 8.6–10.3)
Chloride: 105 mmol/L (ref 98–110)
Creat: 1.75 mg/dL — ABNORMAL HIGH (ref 0.70–1.30)
Globulin: 2.7 g/dL (calc) (ref 1.9–3.7)
Glucose, Bld: 82 mg/dL (ref 65–99)
Potassium: 3.2 mmol/L — ABNORMAL LOW (ref 3.5–5.3)
Sodium: 142 mmol/L (ref 135–146)
Total Bilirubin: 0.6 mg/dL (ref 0.2–1.2)
Total Protein: 7.3 g/dL (ref 6.1–8.1)
eGFR: 46 mL/min/{1.73_m2} — ABNORMAL LOW (ref >=60)

## 2022-03-01 LAB — CBC WITH DIFFERENTIAL/PLATELET
Absolute Monocytes: 524 cells/uL (ref 200–950)
Basophils Absolute: 57 cells/uL (ref 0–200)
Basophils Relative: 1 %
Eosinophils Absolute: 148 cells/uL (ref 15–500)
Eosinophils Relative: 2.6 %
HCT: 43.5 % (ref 38.5–50.0)
Hemoglobin: 14.7 g/dL (ref 13.2–17.1)
Lymphs Abs: 1351 cells/uL (ref 850–3900)
MCH: 30.6 pg (ref 27.0–33.0)
MCHC: 33.8 g/dL (ref 32.0–36.0)
MCV: 90.4 fL (ref 80.0–100.0)
MPV: 11.4 fL (ref 7.5–12.5)
Monocytes Relative: 9.2 %
Neutro Abs: 3620 cells/uL (ref 1500–7800)
Neutrophils Relative %: 63.5 %
Platelets: 182 10*3/uL (ref 140–400)
RBC: 4.81 10*6/uL (ref 4.20–5.80)
RDW: 13.8 % (ref 11.0–15.0)
Total Lymphocyte: 23.7 %
WBC: 5.7 10*3/uL (ref 3.8–10.8)

## 2022-03-01 LAB — LIPASE: Lipase: 45 U/L (ref 7–60)

## 2022-03-02 LAB — URINE CULTURE
MICRO NUMBER:: 13803191
SPECIMEN QUALITY:: ADEQUATE

## 2022-03-03 NOTE — Telephone Encounter (Signed)
Please send 30 days of prescriptions and have patient schedule for a follow up with his PCP. He is well overdue and no showed his last appointment.

## 2022-03-03 NOTE — Addendum Note (Signed)
Addended by: Beatrice Lecher D on: 03/03/2022 03:59 PM   Modules accepted: Orders

## 2022-03-03 NOTE — Progress Notes (Signed)
Call patient: Urine culture just shows a regular skin bacteria no active infection.  Your kidney function jumped up significantly normally your kidney function is around 1.0 this time it was 1.75 and your potassium was borderline low.  Your liver enzymes are also elevated but similar to what they were couple years ago.  No sign of pancreatitis.  Blood count is normal.  I would like to get an ultrasound if of his kidneys.  I am get a go ahead and order that test and try to get him scheduled.

## 2022-03-04 MED ORDER — HYDROCHLOROTHIAZIDE 25 MG PO TABS: 25.0000 mg | ORAL_TABLET | Freq: Every day | ORAL | 1 refills | Status: DC

## 2022-03-04 MED ORDER — METOPROLOL SUCCINATE ER 25 MG PO TB24: ORAL_TABLET | ORAL | 1 refills | Status: DC

## 2022-03-04 MED ORDER — TAMSULOSIN HCL 0.4 MG PO CAPS: 0.4000 mg | ORAL_CAPSULE | Freq: Every day | ORAL | 1 refills | Status: DC

## 2022-03-04 MED ORDER — BUDESONIDE 180 MCG/ACT IN AEPB: 1.0000 | INHALATION_SPRAY | Freq: Two times a day (BID) | RESPIRATORY_TRACT | 6 refills | Status: DC

## 2022-03-04 MED ORDER — POLYSACCHARIDE IRON COMPLEX 150 MG PO CAPS: 150.0000 mg | ORAL_CAPSULE | Freq: Every day | ORAL | 0 refills | Status: DC

## 2022-04-14 ENCOUNTER — Telehealth: Payer: Self-pay | Admitting: General Practice

## 2022-04-14 NOTE — Telephone Encounter (Signed)
Transition Care Management Follow-up Telephone Call Date of discharge and from where: 04/13/22 from [facility] How have you been since you were released from the hospital? Patient is doing better. He started the antibiotics.  Any questions or concerns? No  Items Reviewed: Did the pt receive and understand the discharge instructions provided? Yes  Medications obtained and verified? Yes  Other? No  Any new allergies since your discharge? No  Dietary orders reviewed? Yes Do you have support at home? Yes   Home Care and Equipment/Supplies: Were home health services ordered? no  Functional Questionnaire: (I = Independent and D = Dependent) ADLs: I  Bathing/Dressing- I  Meal Prep- I  Eating- I  Maintaining continence- I  Transferring/Ambulation- I  Managing Meds- I  Follow up appointments reviewed:  PCP Hospital f/u appt confirmed? Yes  Scheduled to see Samuel Bouche on 04/21/22 @ [facility] f/u appt confirmed? No   Are transportation arrangements needed? No  If their condition worsens, is the pt aware to call PCP or go to the Emergency Dept.? Yes Was the patient provided with contact information for the PCP's office or ED? Yes Was to pt encouraged to call back with questions or concerns? Yes

## 2022-04-21 ENCOUNTER — Telehealth: Payer: Self-pay | Admitting: General Practice

## 2022-04-21 NOTE — Telephone Encounter (Signed)
Transition Care Management Follow-up Telephone Call Date of discharge and from where: 04/20/22 from [facility] How have you been since you were released from the hospital? Patient is doing better. Started his antibiotics. He is scheduled to the see the urologist tomorrow. Any questions or concerns? No  Items Reviewed: Did the pt receive and understand the discharge instructions provided? Yes  Medications obtained and verified? No  Other? No  Any new allergies since your discharge? No  Dietary orders reviewed? No Do you have support at home? Yes   Home Care and Equipment/Supplies: Were home health services ordered? no  Functional Questionnaire: (I = Independent and D = Dependent) ADLs: I  Bathing/Dressing- I  Meal Prep- I  Eating- I  Maintaining continence- I  Transferring/Ambulation- I  Managing Meds- I  Follow up appointments reviewed:  PCP Hospital f/u appt confirmed? No  Specialist Hospital f/u appt confirmed? Yes  Scheduled to see the urologist on 04/22/22 @ 1pm. Are transportation arrangements needed? No  If their condition worsens, is the pt aware to call PCP or go to the Emergency Dept.? Yes Was the patient provided with contact information for the PCP's office or ED? Yes Was to pt encouraged to call back with questions or concerns? Yes

## 2022-04-22 ENCOUNTER — Ambulatory Visit: Payer: 59 | Admitting: Medical-Surgical

## 2022-04-24 ENCOUNTER — Ambulatory Visit (INDEPENDENT_AMBULATORY_CARE_PROVIDER_SITE_OTHER): Payer: 59 | Admitting: Medical-Surgical

## 2022-04-24 DIAGNOSIS — Z91199 Patient's noncompliance with other medical treatment and regimen due to unspecified reason: Secondary | ICD-10-CM

## 2022-04-24 NOTE — Progress Notes (Signed)
   Complete physical exam  Patient: Nathan Lang   DOB: 05/03/2000   51 y.o. Male  MRN: [id number]  Subjective:    No chief complaint on file.   Nathan Lang is a 51 y.o. male who presents today for a complete physical exam. She reports consuming a {diet types:17450} diet. {types:19826} She generally feels {DESC; WELL/FAIRLY WELL/POORLY:18703}. She reports sleeping {DESC; WELL/FAIRLY WELL/POORLY:18703}. She {does/does not:200015} have additional problems to discuss today.    Most recent fall risk assessment:    01/08/2022   10:42 AM  Fall Risk   Falls in the past year? 0  Number falls in past yr: 0  Injury with Fall? 0  Risk for fall due to : No Fall Risks  Follow up Falls evaluation completed     Most recent depression screenings:    01/08/2022   10:42 AM 11/29/2020   10:46 AM  PHQ 2/9 Scores  PHQ - 2 Score 0 0  PHQ- 9 Score 5     {VISON DENTAL STD PSA (Optional):27386}  {History (Optional):23778}  Patient Care Team: Onofrio Klemp, NP as PCP - General (Nurse Practitioner)   Outpatient Medications Prior to Visit  Medication Sig   fluticasone (FLONASE) 50 MCG/ACT nasal spray Place 2 sprays into both nostrils in the morning and at bedtime. After 7 days, reduce to once daily.   norgestimate-ethinyl estradiol (SPRINTEC 28) 0.25-35 MG-MCG tablet Take 1 tablet by mouth daily.   Nystatin POWD Apply liberally to affected area 2 times per day   spironolactone (ALDACTONE) 100 MG tablet Take 1 tablet (100 mg total) by mouth daily.   No facility-administered medications prior to visit.    ROS        Objective:     There were no vitals taken for this visit. {Vitals History (Optional):23777}  Physical Exam   No results found for any visits on 02/13/22. {Show previous labs (optional):23779}    Assessment & Plan:    Routine Health Maintenance and Physical Exam  Immunization History  Administered Date(s) Administered   DTaP 07/17/1999, 09/12/1999,  11/21/1999, 08/06/2000, 02/20/2004   Hepatitis A 12/17/2007, 12/22/2008   Hepatitis B 05/04/1999, 06/11/1999, 11/21/1999   HiB (PRP-OMP) 07/17/1999, 09/12/1999, 11/21/1999, 08/06/2000   IPV 07/17/1999, 09/12/1999, 05/11/2000, 02/20/2004   Influenza,inj,Quad PF,6+ Mos 03/24/2014   Influenza-Unspecified 06/23/2012   MMR 05/11/2001, 02/20/2004   Meningococcal Polysaccharide 12/22/2011   Pneumococcal Conjugate-13 08/06/2000   Pneumococcal-Unspecified 11/21/1999, 02/04/2000   Tdap 12/22/2011   Varicella 05/11/2000, 12/17/2007    Health Maintenance  Topic Date Due   HIV Screening  Never done   Hepatitis C Screening  Never done   INFLUENZA VACCINE  02/11/2022   PAP-Cervical Cytology Screening  02/13/2022 (Originally 05/02/2020)   PAP SMEAR-Modifier  02/13/2022 (Originally 05/02/2020)   TETANUS/TDAP  02/13/2022 (Originally 12/21/2021)   HPV VACCINES  Discontinued   COVID-19 Vaccine  Discontinued    Discussed health benefits of physical activity, and encouraged her to engage in regular exercise appropriate for her age and condition.  Problem List Items Addressed This Visit   None Visit Diagnoses     Annual physical exam    -  Primary   Cervical cancer screening       Need for Tdap vaccination          No follow-ups on file.     Herschel Fleagle, NP   

## 2022-05-01 ENCOUNTER — Other Ambulatory Visit: Payer: Self-pay | Admitting: Medical-Surgical

## 2022-05-01 NOTE — Telephone Encounter (Signed)
Patient needs an appointment for further refills.  Last office visit 08/30/2021  NO Show 04/24/2022  NO Show 11/29/2021  Last filled 01/15/2022  Sent 30 day supply. NO refills.

## 2022-05-02 NOTE — Telephone Encounter (Signed)
Scheduled 05/27/2022- tvt

## 2022-05-27 ENCOUNTER — Ambulatory Visit: Payer: 59 | Admitting: Medical-Surgical

## 2022-05-29 ENCOUNTER — Other Ambulatory Visit: Payer: Self-pay | Admitting: Medical-Surgical

## 2022-05-29 NOTE — Telephone Encounter (Signed)
Patient needs an appointment for further refills for escitalopram (LEXAPRO) 10 MG tablet   Last office visit 08/30/2021  NO - Show 11/29/2021, 04/22/2022, 04/24/2022, 05/27/2022  Last filled 05/01/2022  Sent 15 day supply to the pharmacy. No Refills.

## 2022-05-29 NOTE — Telephone Encounter (Signed)
LVM for patient to call back to schedule a appointment for further refills and to let the patient know we did refill the Rx for 15 tablets. Further refills for escitalopram (LEXAPRO) 10 MG tablet. Tvt

## 2022-06-14 ENCOUNTER — Other Ambulatory Visit: Payer: Self-pay | Admitting: Medical-Surgical

## 2022-06-15 ENCOUNTER — Other Ambulatory Visit: Payer: Self-pay | Admitting: Medical-Surgical

## 2022-06-16 NOTE — Telephone Encounter (Signed)
Scheduled an appointment for 07/15/2022 @ 10:40- tvt

## 2022-06-16 NOTE — Telephone Encounter (Signed)
Patient needs an appointment for refills on iron polysaccharides (NIFEREX) 150 MG capsule.  Last office visit 08/30/2021  No show 04/24/2022 and 05/27/2022  Refills refused

## 2022-07-15 ENCOUNTER — Encounter: Payer: Self-pay | Admitting: Medical-Surgical

## 2022-07-15 ENCOUNTER — Ambulatory Visit (INDEPENDENT_AMBULATORY_CARE_PROVIDER_SITE_OTHER): Payer: Self-pay | Admitting: Medical-Surgical

## 2022-07-15 VITALS — BP 125/79 | HR 76 | Resp 20 | Ht 67.0 in | Wt 197.1 lb

## 2022-07-15 DIAGNOSIS — I10 Essential (primary) hypertension: Secondary | ICD-10-CM

## 2022-07-15 DIAGNOSIS — J4540 Moderate persistent asthma, uncomplicated: Secondary | ICD-10-CM

## 2022-07-15 DIAGNOSIS — Z1211 Encounter for screening for malignant neoplasm of colon: Secondary | ICD-10-CM

## 2022-07-15 DIAGNOSIS — N401 Enlarged prostate with lower urinary tract symptoms: Secondary | ICD-10-CM

## 2022-07-15 DIAGNOSIS — E611 Iron deficiency: Secondary | ICD-10-CM

## 2022-07-15 DIAGNOSIS — K219 Gastro-esophageal reflux disease without esophagitis: Secondary | ICD-10-CM

## 2022-07-15 DIAGNOSIS — R338 Other retention of urine: Secondary | ICD-10-CM

## 2022-07-15 DIAGNOSIS — F411 Generalized anxiety disorder: Secondary | ICD-10-CM

## 2022-07-15 MED ORDER — HYDROCHLOROTHIAZIDE 25 MG PO TABS: 25.0000 mg | ORAL_TABLET | Freq: Every day | ORAL | 1 refills | Status: DC

## 2022-07-15 MED ORDER — FAMOTIDINE 40 MG PO TABS: 40.0000 mg | ORAL_TABLET | Freq: Every day | ORAL | 0 refills | Status: DC

## 2022-07-15 MED ORDER — POLYSACCHARIDE IRON COMPLEX 150 MG PO CAPS: 150.0000 mg | ORAL_CAPSULE | Freq: Every day | ORAL | 0 refills | Status: DC

## 2022-07-15 MED ORDER — ESCITALOPRAM OXALATE 10 MG PO TABS: 10.0000 mg | ORAL_TABLET | Freq: Every day | ORAL | 1 refills | Status: DC

## 2022-07-15 MED ORDER — METOPROLOL SUCCINATE ER 50 MG PO TB24: ORAL_TABLET | ORAL | 1 refills | Status: DC

## 2022-07-15 MED ORDER — METOPROLOL SUCCINATE ER 25 MG PO TB24: ORAL_TABLET | ORAL | 1 refills | Status: DC

## 2022-07-15 NOTE — Progress Notes (Signed)
Established Patient Office Visit  Subjective   Patient ID: Nathan Lang, male   DOB: [birth year] Age: 52 y.o. MRN: [id number]   Chief Complaint  Patient presents with   Medication Refill   HPI Very pleasant 52 year old male accompanied by his wife presenting today for the following:  Hypertension: Taking hydrochlorothiazide 25 mg and Toprol-XL 75 mg daily, tolerating well without side effects.  Monitoring blood pressure at home and notes his readings are mostly good.  Some days it is a little high in the morning but once he takes his medications it returns to normal.  When his blood pressure is elevated he experiences some intermittent dizziness but no other concerning symptoms.  GERD: Taking famotidine 40 mg daily, tolerating well without side effects.  Feels this is working well to keep his reflux symptoms stable.  Iron deficiency: Currently taking iron supplements once daily, tolerating well without significant GI intolerances.  Had his iron levels checked about 8 weeks ago showing continued low percent sat and total iron but ferritin stores looked great.  Mood: Has Lexapro 10 mg daily at home that he uses as an as needed medication.  Notes he only needs to take a dose about once a month to help manage his mood.  Feels this is very helpful for him.   Objective:    Vitals:   07/15/22 1100  BP: 125/79  Pulse: 76  Resp: 20  Height: '5\' 7"'$  (1.702 m)  Weight: 197 lb 1.9 oz (89.4 kg)  SpO2: 97%  BMI (Calculated): 30.87    Physical Exam Vitals reviewed.  Constitutional:      General: He is not in acute distress.    Appearance: Normal appearance. He is obese. He is not ill-appearing.  HENT:     Head: Normocephalic and atraumatic.  Cardiovascular:     Rate and Rhythm: Normal rate and regular rhythm.     Pulses: Normal pulses.     Heart sounds: Normal heart sounds. No murmur heard.    No friction rub. No gallop.  Pulmonary:     Effort: Pulmonary effort is normal. No respiratory  distress.     Breath sounds: Normal breath sounds.  Skin:    General: Skin is warm and dry.  Neurological:     Mental Status: He is alert and oriented to person, place, and time.  Psychiatric:        Mood and Affect: Mood normal.        Behavior: Behavior normal.        Thought Content: Thought content normal.        Judgment: Judgment normal.   No results found for this or any previous visit (from the past 24 hour(s)).     The 10-year ASCVD risk score (Arnett DK, et al., 2019) is: 10.1%   Values used to calculate the score:     Age: 58 years     Sex: Male     Is Non-Hispanic African American: No     Diabetic: No     Tobacco smoker: Yes     Systolic Blood Pressure: 585 mmHg     Is BP treated: Yes     HDL Cholesterol: 35 mg/dL     Total Cholesterol: 158 mg/dL   Assessment & Plan:   1. Primary hypertension Blood pressure stable at goal today.  Continue hydrochlorothiazide and Toprol-XL as prescribed.  Continue monitoring blood pressure at home with goal of 130/80 or less.  If consistently elevated, return for further  evaluation and medication management.  2. Generalized anxiety disorder Since it is helping, continue Lexapro 10 mg daily as needed.  3. Gastroesophageal reflux disease without esophagitis Continue famotidine 40 mg daily.  4. Moderate persistent asthma without complication Continue Symbicort and as needed albuterol.  5. Iron deficiency Continue daily oral iron supplementation.  6. Benign prostatic hyperplasia with urinary retention Continue Flomax 0.4 mg daily.  7. Colon cancer screening With his history of prostate cancer, recommend updating colon cancer screening.  Referring to GI for colonoscopy. - Ambulatory referral to Gastroenterology  Return in about 6 months (around 01/13/2023) for chronic disease follow up.  ___________________________________________ Clearnce Sorrel, DNP, APRN, FNP-BC Primary Care and [place]

## 2022-09-15 ENCOUNTER — Other Ambulatory Visit: Payer: Self-pay | Admitting: Medical-Surgical

## 2022-09-17 ENCOUNTER — Telehealth: Payer: Self-pay

## 2022-09-17 NOTE — Transitions of Care (Post Inpatient/ED Visit) (Signed)
   09/17/2022  Name: Nathan Lang MRN: [id number] DOB: [birth year]  Today's TOC FU Call Status: Today's TOC FU Call Status:: Successful TOC FU Call Competed TOC FU Call Complete Date: 09/17/22  Transition Care Management Follow-up Telephone Call Date of Discharge: 09/16/22 Discharge Facility: Other ([facility]) Name of Other (Non-[facility]) Discharge Facility: [facility] Type of Discharge: Inpatient Admission How have you been since you were released from the hospital?: Better Any questions or concerns?: No  Items Reviewed: Did you receive and understand the discharge instructions provided?: Yes Medications obtained and verified?: Yes (Medications Reviewed) Any new allergies since your discharge?: No Dietary orders reviewed?: Yes  Home Care and Equipment/Supplies: [facility] Ordered?: NA Any new equipment or medical supplies ordered?: NA  Functional Questionnaire: Do you need assistance with bathing/showering or dressing?: Yes Do you need assistance with meal preparation?: No Do you need assistance with eating?: No Do you have difficulty maintaining continence: No Do you need assistance with getting out of bed/getting out of a chair/moving?: No Do you have difficulty managing or taking your medications?: No  Folllow up appointments reviewed: PCP Follow-up appointment confirmed?: NA Specialist Hospital Follow-up appointment confirmed?: Yes Date of Specialist follow-up appointment?: 09/26/22 Follow-Up Specialty Provider:: Urology Do you need transportation to your follow-up appointment?: No Do you understand care options if your condition(s) worsen?: Yes-patient verbalized understanding    SIGNATURE Juanda Crumble, [place] Nurse Health Advisor Direct Dial [phone]

## 2022-10-21 ENCOUNTER — Other Ambulatory Visit: Payer: Self-pay | Admitting: Medical-Surgical

## 2022-10-21 LAB — HM COLONOSCOPY

## 2022-12-05 ENCOUNTER — Encounter: Payer: Self-pay | Admitting: Medical-Surgical

## 2023-01-16 ENCOUNTER — Ambulatory Visit: Payer: 59 | Admitting: Medical-Surgical

## 2023-02-04 ENCOUNTER — Other Ambulatory Visit: Payer: Self-pay | Admitting: Medical-Surgical

## 2023-03-12 ENCOUNTER — Telehealth: Payer: Self-pay | Admitting: Medical-Surgical

## 2023-03-12 ENCOUNTER — Other Ambulatory Visit: Payer: Self-pay

## 2023-03-12 MED ORDER — POLYSACCHARIDE IRON COMPLEX 150 MG PO CAPS: 150.0000 mg | ORAL_CAPSULE | Freq: Every day | ORAL | 0 refills | Status: DC

## 2023-03-12 NOTE — Telephone Encounter (Signed)
Task completed. Requested rx sent to the pharmacy on file. Please inform the patient that he is overdue for a visit with his provider to follow up on his blood pressure. Thanks in advance.

## 2023-03-12 NOTE — Telephone Encounter (Signed)
Pt requesting refill on iron polysaccharides 150mg . Pharmacy on file is correct. Pt took last one today. Contact number on file is correct.

## 2023-03-31 ENCOUNTER — Ambulatory Visit (INDEPENDENT_AMBULATORY_CARE_PROVIDER_SITE_OTHER): Payer: Managed Care, Other (non HMO) | Admitting: Medical-Surgical

## 2023-03-31 ENCOUNTER — Encounter: Payer: Self-pay | Admitting: Medical-Surgical

## 2023-03-31 VITALS — BP 106/60 | HR 54 | Resp 20 | Ht 67.0 in | Wt 184.7 lb

## 2023-03-31 DIAGNOSIS — C61 Malignant neoplasm of prostate: Secondary | ICD-10-CM

## 2023-03-31 DIAGNOSIS — F411 Generalized anxiety disorder: Secondary | ICD-10-CM | POA: Diagnosis not present

## 2023-03-31 DIAGNOSIS — I10 Essential (primary) hypertension: Secondary | ICD-10-CM

## 2023-03-31 DIAGNOSIS — I480 Paroxysmal atrial fibrillation: Secondary | ICD-10-CM | POA: Diagnosis not present

## 2023-03-31 DIAGNOSIS — K219 Gastro-esophageal reflux disease without esophagitis: Secondary | ICD-10-CM

## 2023-03-31 MED ORDER — METOPROLOL SUCCINATE ER 25 MG PO TB24: ORAL_TABLET | ORAL | 1 refills | Status: DC

## 2023-03-31 MED ORDER — HYDROCHLOROTHIAZIDE 25 MG PO TABS: 25.0000 mg | ORAL_TABLET | Freq: Every day | ORAL | 1 refills | Status: AC

## 2023-03-31 MED ORDER — ALBUTEROL SULFATE HFA 108 (90 BASE) MCG/ACT IN AERS: 1.0000 | INHALATION_SPRAY | RESPIRATORY_TRACT | 99 refills | Status: AC | PRN

## 2023-03-31 MED ORDER — FAMOTIDINE 40 MG PO TABS: 40.0000 mg | ORAL_TABLET | Freq: Every day | ORAL | 1 refills | Status: DC

## 2023-03-31 MED ORDER — POLYSACCHARIDE IRON COMPLEX 150 MG PO CAPS: 150.0000 mg | ORAL_CAPSULE | Freq: Every day | ORAL | 1 refills | Status: DC

## 2023-03-31 NOTE — Progress Notes (Signed)
        Established patient visit  History, exam, impression, and plan:  1. Primary hypertension Pleasant 52 year old male presenting today with a history of hypertension currently treated with hydrochlorothiazide 25 mg daily and Toprol-XL 25 mg daily.  Back in January, he was taking 75 mg of Toprol XL daily however has only been taking 25 mg recently.  Has been monitoring blood pressure and notes that it is usually at goal or below. Denies CP, SOB, palpitations, lower extremity edema, dizziness, headaches, or vision changes.  HRR, S1/S2 normal.  No peripheral edema.  Respirations even and unlabored.  Lungs CTA.  Blood pressure well-controlled.  Continue Toprol-XL and HCTZ as prescribed.  2. Gastroesophageal reflux disease without esophagitis Has been taking famotidine 40 mg daily, tolerating well without side effects.  Feels the medication works very well to keep his reflux symptoms controlled.  Actively working to avoid known food triggers.  Denies nausea, vomiting, hematemesis, hematochezia, and melena.  No epigastric pain.  Continue famotidine 40 mg daily as prescribed.  3. Generalized anxiety disorder Previously taking Lexapro but has been off of this for quite a while.  Notes that he does have some stress and anxiety regarding his recent recurrence of prostate cancer but is using family resources as support.  No desire to restart mood altering medication at this time.  4. Paroxysmal atrial fibrillation (HCC) Well-controlled on Toprol-XL.  5. Prostate cancer [facility]) Managed by oncology.   Procedures performed this visit: None.  Return in about 6 months (around 09/28/2023) for HTN follow up.  __________________________________ Thayer Ohm, DNP, APRN, FNP-BC Primary Care and Sports Medicine [facility] [place]

## 2023-04-29 ENCOUNTER — Telehealth: Payer: Self-pay

## 2023-04-29 NOTE — Telephone Encounter (Signed)
Patient stopped by office with questions regarding medications  He states he was taking escitalopram but only as needed for anxiety .  I told him this medication did not work taken sporadically but would need to be taken daily to work.  Should he continue this medication if so would need a new rx as old bottle had expired ?  He also brought in a bottle of benazepril written last 01/2022.  I told him that I did not think he was to take this medication any longer as was not on his current medication list.  Please advise- thank you :)

## 2023-04-30 NOTE — Telephone Encounter (Signed)
Patient informed. 

## 2023-05-12 ENCOUNTER — Other Ambulatory Visit: Payer: Self-pay | Admitting: Medical-Surgical

## 2023-10-27 LAB — HM COLONOSCOPY

## 2023-11-09 ENCOUNTER — Other Ambulatory Visit: Payer: Self-pay

## 2023-11-09 MED ORDER — FAMOTIDINE 40 MG PO TABS: 40.0000 mg | ORAL_TABLET | Freq: Every day | ORAL | 1 refills | Status: AC

## 2023-12-11 ENCOUNTER — Ambulatory Visit (INDEPENDENT_AMBULATORY_CARE_PROVIDER_SITE_OTHER): Admitting: Medical-Surgical

## 2023-12-11 ENCOUNTER — Encounter: Payer: Self-pay | Admitting: Medical-Surgical

## 2023-12-11 VITALS — BP 138/83 | HR 69 | Resp 20 | Ht 67.0 in | Wt 186.0 lb

## 2023-12-11 DIAGNOSIS — I10 Essential (primary) hypertension: Secondary | ICD-10-CM

## 2023-12-11 DIAGNOSIS — Z09 Encounter for follow-up examination after completed treatment for conditions other than malignant neoplasm: Secondary | ICD-10-CM

## 2023-12-11 DIAGNOSIS — I480 Paroxysmal atrial fibrillation: Secondary | ICD-10-CM

## 2023-12-11 DIAGNOSIS — F411 Generalized anxiety disorder: Secondary | ICD-10-CM

## 2023-12-11 MED ORDER — ESCITALOPRAM OXALATE 10 MG PO TABS: ORAL_TABLET | ORAL | 1 refills | Status: AC

## 2023-12-11 MED ORDER — METOPROLOL SUCCINATE ER 25 MG PO TB24: 25.0000 mg | ORAL_TABLET | Freq: Every day | ORAL | 1 refills | Status: AC | PRN

## 2023-12-11 NOTE — Progress Notes (Signed)
        Established patient visit  History, exam, impression, and plan:  1. Hospital discharge follow-up (Primary) Nathan Lang 53 year old male presenting today for hospital discharge follow up. He was admitted to [facility] on 12/03/23 for dizziness, hypokalemia, bradycardia, and chronic A fib. He was brought in by EMS after becoming weak and dizzy while at work. His workup was positive for AKI suspected to be caused by dehydration. He received IVF and the AKI resolved. On discharge, his hydrochlorothiazide  and potassium replacement was held. His Toprol -XL was changed to PRN for HR >110. He was discharged on 12/05/23 and reports doing well overall. He remains weak and exhausted but feels this is related to his treatment for prostate cancer. His BP and heart rate are stable today. Checking BMET and Magnesium per discharge summary recommendations.   2. Primary hypertension Was previously treated with hydrochlorothiazide  and potassium supplementation for hypertension. Also had Toprol -XL 25 mg daily. Since discharge, he has remained off the medications as instructed. Today his BP is 138/83 which is a little higher than the goal but given his health concerns and current treatments, this is acceptable. Continue to hold hydrochlorothiazide . Only use Toprol -XL as needed. Monitor BP at home and if it stays elevated greater than 140/90, return for further evaluation. Checking labs.  - Basic Metabolic Panel (BMET) - Magnesium  3. Paroxysmal atrial fibrillation (HCC) As noted above, changing Toprol -XL to prn. HR irregular, rate controlled today in office.   4. Generalized anxiety disorder Notes that he has been very stressed lately regarding his health and several other life situations. Is interested in medications to help. Not doing any counseling. Denies SI/HI. Start Lexapro  5mg  daily for one week then increased to 10mg  daily. Would like to see how the medication works before considering counseling. Return in 4-6  weeks for follow up.   Procedures performed this visit: None.  Return in about 6 weeks (around 01/22/2024) for mood follow up.  __________________________________ Maryl Snook, DNP, APRN, FNP-BC Primary Care and Sports Medicine [facility] [place]

## 2023-12-12 ENCOUNTER — Ambulatory Visit: Payer: Self-pay | Admitting: Medical-Surgical

## 2023-12-12 LAB — BASIC METABOLIC PANEL WITH GFR
BUN/Creatinine Ratio: 20 (ref 9–20)
BUN: 26 mg/dL — ABNORMAL HIGH (ref 6–24)
CO2: 18 mmol/L — ABNORMAL LOW (ref 20–29)
Calcium: 9.3 mg/dL (ref 8.7–10.2)
Chloride: 104 mmol/L (ref 96–106)
Creatinine, Ser: 1.33 mg/dL — ABNORMAL HIGH (ref 0.76–1.27)
Glucose: 93 mg/dL (ref 70–99)
Potassium: 3.4 mmol/L — ABNORMAL LOW (ref 3.5–5.2)
Sodium: 140 mmol/L (ref 134–144)
eGFR: 64 mL/min/{1.73_m2} (ref >59)

## 2023-12-12 LAB — MAGNESIUM: Magnesium: 2.1 mg/dL (ref 1.6–2.3)

## 2024-01-22 ENCOUNTER — Ambulatory Visit: Admitting: Medical-Surgical
